# Patient Record
Sex: Male | Born: 1970 | Race: White | Hispanic: No | Marital: Married | Smoking: Never smoker
Health system: Southern US, Community
[De-identification: ages and names within clinical notes are randomized; demographics above are authoritative.]

## PROBLEM LIST (undated history)

## (undated) DIAGNOSIS — E119 Type 2 diabetes mellitus without complications: Secondary | ICD-10-CM

## (undated) DIAGNOSIS — I10 Essential (primary) hypertension: Secondary | ICD-10-CM

## (undated) DIAGNOSIS — J45909 Unspecified asthma, uncomplicated: Secondary | ICD-10-CM

## (undated) DIAGNOSIS — E785 Hyperlipidemia, unspecified: Secondary | ICD-10-CM

## (undated) HISTORY — PX: GASTRIC RESTRICTION SURGERY: SHX653

## (undated) HISTORY — PX: CHOLECYSTECTOMY: SHX55

## (undated) HISTORY — DX: Hyperlipidemia, unspecified: E78.5

---

## 1999-05-24 DIAGNOSIS — E1165 Type 2 diabetes mellitus with hyperglycemia: Secondary | ICD-10-CM | POA: Insufficient documentation

## 2005-12-11 ENCOUNTER — Other Ambulatory Visit: Payer: Self-pay

## 2005-12-11 ENCOUNTER — Emergency Department: Payer: Self-pay | Admitting: Unknown Physician Specialty

## 2007-02-25 ENCOUNTER — Emergency Department: Payer: Self-pay | Admitting: Internal Medicine

## 2007-04-26 ENCOUNTER — Ambulatory Visit: Payer: Self-pay | Admitting: Unknown Physician Specialty

## 2008-11-24 ENCOUNTER — Ambulatory Visit: Payer: Self-pay | Admitting: Internal Medicine

## 2009-12-13 ENCOUNTER — Ambulatory Visit: Payer: Self-pay | Admitting: Internal Medicine

## 2013-06-03 ENCOUNTER — Ambulatory Visit: Payer: Self-pay | Admitting: Family Medicine

## 2013-08-13 DIAGNOSIS — E66811 Obesity, class 1: Secondary | ICD-10-CM | POA: Insufficient documentation

## 2013-08-13 DIAGNOSIS — R809 Proteinuria, unspecified: Secondary | ICD-10-CM | POA: Insufficient documentation

## 2013-08-13 DIAGNOSIS — M722 Plantar fascial fibromatosis: Secondary | ICD-10-CM | POA: Diagnosis present

## 2013-08-13 DIAGNOSIS — K760 Fatty (change of) liver, not elsewhere classified: Secondary | ICD-10-CM | POA: Diagnosis present

## 2013-08-13 DIAGNOSIS — L739 Follicular disorder, unspecified: Secondary | ICD-10-CM | POA: Insufficient documentation

## 2014-01-01 DIAGNOSIS — R160 Hepatomegaly, not elsewhere classified: Secondary | ICD-10-CM | POA: Insufficient documentation

## 2014-08-19 DIAGNOSIS — E782 Mixed hyperlipidemia: Secondary | ICD-10-CM | POA: Insufficient documentation

## 2014-08-19 DIAGNOSIS — G4733 Obstructive sleep apnea (adult) (pediatric): Secondary | ICD-10-CM | POA: Insufficient documentation

## 2014-08-20 ENCOUNTER — Ambulatory Visit: Admit: 2014-08-20 | Disposition: A | Discharge status: Home / Self Care | Payer: Self-pay | Admitting: Internal Medicine

## 2014-09-11 DIAGNOSIS — G5601 Carpal tunnel syndrome, right upper limb: Secondary | ICD-10-CM | POA: Insufficient documentation

## 2014-10-16 DIAGNOSIS — I10 Essential (primary) hypertension: Secondary | ICD-10-CM | POA: Diagnosis present

## 2015-03-13 DIAGNOSIS — Z789 Other specified health status: Secondary | ICD-10-CM | POA: Insufficient documentation

## 2016-02-02 DIAGNOSIS — G8929 Other chronic pain: Secondary | ICD-10-CM | POA: Insufficient documentation

## 2016-08-30 DIAGNOSIS — I10 Essential (primary) hypertension: Secondary | ICD-10-CM | POA: Insufficient documentation

## 2016-08-30 DIAGNOSIS — E785 Hyperlipidemia, unspecified: Secondary | ICD-10-CM | POA: Diagnosis present

## 2016-08-30 DIAGNOSIS — E119 Type 2 diabetes mellitus without complications: Secondary | ICD-10-CM

## 2016-08-30 DIAGNOSIS — E1129 Type 2 diabetes mellitus with other diabetic kidney complication: Secondary | ICD-10-CM

## 2017-10-26 ENCOUNTER — Ambulatory Visit
Admission: EM | Admit: 2017-10-26 | Discharge: 2017-10-26 | Disposition: A | Discharge status: Home / Self Care | Payer: Self-pay | Attending: Family Medicine | Admitting: Family Medicine

## 2017-10-26 ENCOUNTER — Other Ambulatory Visit: Payer: Self-pay

## 2017-10-26 ENCOUNTER — Encounter: Payer: Self-pay | Admitting: Emergency Medicine

## 2017-10-26 DIAGNOSIS — H1011 Acute atopic conjunctivitis, right eye: Secondary | ICD-10-CM

## 2017-10-26 DIAGNOSIS — J3089 Other allergic rhinitis: Secondary | ICD-10-CM

## 2017-10-26 HISTORY — DX: Essential (primary) hypertension: I10

## 2017-10-26 HISTORY — DX: Type 2 diabetes mellitus without complications: E11.9

## 2017-10-26 MED ORDER — KETOTIFEN FUMARATE 0.025 % OP SOLN: 2.0000 [drp] | Freq: Two times a day (BID) | OPHTHALMIC | 0 refills | Status: DC

## 2017-10-26 MED ORDER — GENTAMICIN SULFATE 0.3 % OP SOLN: 2.0000 [drp] | Freq: Four times a day (QID) | OPHTHALMIC | 0 refills | Status: DC

## 2017-10-26 NOTE — ED Triage Notes (Signed)
Patient c/o redness, swelling and pain in his right eye that started yesterday.  Patient reports that he has been having allegries.

## 2017-10-26 NOTE — ED Provider Notes (Signed)
[facility]    CSN: [id number] Arrival date & time: 10/26/17  1726     History   Chief Complaint Chief Complaint  Patient presents with  . Eye Problem    right    HPI Caleb Nelson is a 47 y.o. male.   46 year old male presents with right eye redness, eye lid swelling and irritation that started last evening. Woke up with his right eye swollen shut with yellowish discharge this morning but now experiencing clear discharge. Right eye is still itchy and burning. Left eye is asymptomatic. He denies any fever or change in vision. He does not wear routine glasses or contacts. He does have a history of significant environmental allergies but not currently on any medication. He is currently experiencing a runny nose, scratchy throat and slight cough. He had hives and unknown allergic reaction over 1 week ago. He saw his PCP who put him on 6 days of Prednisone and Benadryl. His hives resolved and he finished the Prednisone and Benadryl 2 days ago. No new known exposure. He has not tried any OTC eye drops. He does have a history of HTN and DM but had gastric restriction surgery in January, has lost about 100 lbs and no longer on any medication except multivitamins and calcium.   The history is provided by the patient.  Eye Problem  Location:  Right eye Quality:  Tearing and burning Severity:  Moderate Onset quality:  Sudden Duration:  1 day Timing:  Constant Progression:  Waxing and waning Chronicity:  New Context: not burn, not chemical exposure, not contact lens problem, not direct trauma, not foreign body, not using machinery, not scratch, not smoke exposure and not UV exposure   Relieved by:  None tried Worsened by:  Nothing Ineffective treatments:  None tried Associated symptoms: crusting, discharge, itching, redness, swelling (right eye lid) and tearing   Associated symptoms: no blurred vision, no decreased vision, no double vision, no facial rash, no headaches, no  inflammation, no nausea, no numbness, no photophobia, no tingling, no vomiting and no weakness   Risk factors: no conjunctival hemorrhage, no exposure to pinkeye, no previous injury to eye and no recent herpes zoster     Past Medical History:  Diagnosis Date  . Diabetes mellitus without complication (HCC)   . Hypertension     There are no active problems to display for this patient.   Past Surgical History:  Procedure Laterality Date  . GASTRIC RESTRICTION SURGERY         Home Medications    Prior to Admission medications   Medication Sig Start Date End Date Taking? Authorizing Provider  glucose blood (PRECISION QID TEST) test strip Use 3 (three) times daily. Use as instructed. Contour DX: E11.9 11/29/16 11/05/17 Yes [provider]  pantoprazole (PROTONIX) 40 MG tablet Take by mouth. 05/25/17 11/21/17 Yes [provider]  gentamicin (GARAMYCIN) 0.3 % ophthalmic solution Place 2 drops into the right eye 4 (four) times daily. 10/26/17   Sudie Grumbling, NP  ketotifen (ZADITOR) 0.025 % ophthalmic solution Place 2 drops into the right eye 2 (two) times daily. 10/26/17   Sudie Grumbling, NP  Multiple Vitamin (MULTI-VITAMINS) TABS Take by mouth.    [provider]    Family History History reviewed. No pertinent family history.  Social History Social History   Tobacco Use  . Smoking status: Never Smoker  . Smokeless tobacco: Never Used  Substance Use Topics  . Alcohol use: Never  Frequency: Never  . Drug use: Never     Allergies   Patient has no known allergies.   Review of Systems Review of Systems  Constitutional: Negative for activity change, appetite change, chills, fatigue and fever.  HENT: Positive for congestion, facial swelling (around right eye), postnasal drip, rhinorrhea, sneezing and sore throat. Negative for ear discharge, ear pain, mouth sores, nosebleeds, sinus pressure, sinus pain and trouble swallowing.   Eyes: Positive for pain  (irritation), discharge, redness and itching. Negative for blurred vision, double vision, photophobia and visual disturbance.  Respiratory: Positive for cough. Negative for chest tightness, shortness of breath and wheezing.   Gastrointestinal: Negative for diarrhea, nausea and vomiting.  Musculoskeletal: Negative for arthralgias, myalgias, neck pain and neck stiffness.  Skin: Positive for color change. Negative for rash and wound.  Allergic/Immunologic: Positive for environmental allergies.  Neurological: Negative for dizziness, tingling, tremors, seizures, syncope, speech difficulty, weakness, light-headedness, numbness and headaches.  Hematological: Negative for adenopathy. Does not bruise/bleed easily.     Physical Exam Triage Vital Signs ED Triage Vitals  Enc Vitals Group     BP 10/26/17 1748 (!) 154/93     Pulse Rate 10/26/17 1748 71     Resp 10/26/17 1748 16     Temp 10/26/17 1748 99.3 F (37.4 C)     Temp Source 10/26/17 1748 Oral     SpO2 10/26/17 1748 100 %     Weight 10/26/17 1744 227 lb (103 kg)     Height 10/26/17 1744 6' (1.829 m)     Head Circumference --      Peak Flow --      Pain Score 10/26/17 1744 2     Pain Loc --      Pain Edu? --      Excl. in GC? --    No data found.  Updated Vital Signs BP (!) 154/93 (BP Location: Left Arm)   Pulse 71   Temp 99.3 F (37.4 C) (Oral)   Resp 16   Ht 6' (1.829 m)   Wt 227 lb (103 kg)   SpO2 100%   BMI 30.79 kg/m   Visual Acuity Right Eye Distance: 20/30 uncorrected Left Eye Distance: 20/25 uncorrected Bilateral Distance: 20/25 uncorrected  Right Eye Near:   Left Eye Near:    Bilateral Near:     Physical Exam  Constitutional: He is oriented to person, place, and time. He appears well-developed and well-nourished. He is cooperative. No distress.  Patient sitting on exam table in no acute distress. Right eye is noticeably red and swollen.   HENT:  Head: Normocephalic and atraumatic.  Right Ear: Hearing,  tympanic membrane, external ear and ear canal normal.  Left Ear: Hearing, tympanic membrane, external ear and ear canal normal.  Nose: Mucosal edema and rhinorrhea present. Right sinus exhibits no maxillary sinus tenderness and no frontal sinus tenderness. Left sinus exhibits no maxillary sinus tenderness and no frontal sinus tenderness.  Mouth/Throat: Uvula is midline and mucous membranes are normal. Oropharyngeal exudate (slight clear post nasal drainage present) present.  Eyes: Pupils are equal, round, and reactive to light. EOM are normal. Right eye exhibits chemosis and discharge. Right eye exhibits no hordeolum. No foreign body present in the right eye. Left eye exhibits no chemosis, no discharge and no hordeolum. No foreign body present in the left eye. Right conjunctiva is injected. Right conjunctiva has no hemorrhage. Left conjunctiva is not injected. Left conjunctiva has no hemorrhage. No scleral icterus.  Fundoscopic exam:  The right eye shows no hemorrhage and no papilledema. The right eye shows red reflex.       The left eye shows no hemorrhage and no papilledema. The left eye shows red reflex.    Right eyelid upper and lower lids swollen and red. Lower lid is slightly more swollen and irritated than upper lid. Non-tender. Clear drainage/tearing present. No pain with movement.   Neck: Normal range of motion. Neck supple.  Cardiovascular: Normal rate, regular rhythm and normal heart sounds.  No murmur heard. Pulmonary/Chest: Effort normal and breath sounds normal. No respiratory distress. He has no decreased breath sounds. He has no wheezes. He has no rhonchi. He has no rales.  Musculoskeletal: Normal range of motion.  Lymphadenopathy:    He has no cervical adenopathy.  Neurological: He is alert and oriented to person, place, and time.  Skin: Skin is warm and dry. Capillary refill takes less than 2 seconds. No rash noted. There is erythema (around right eye).  Psychiatric: He has  a normal mood and affect. His behavior is normal. Judgment and thought content normal.  Vitals reviewed.    UC Treatments / Results  Labs (all labs ordered are listed, but only abnormal results are displayed) Labs Reviewed - No data to display  EKG None  Radiology No results found.  Procedures Procedures (including critical care time)  Medications Ordered in UC Medications - No data to display  Initial Impression / Assessment and Plan / UC Course  I have reviewed the triage vital signs and the nursing notes.  Pertinent labs & imaging results that were available during my care of the patient were reviewed by me and considered in my medical decision making (see chart for details).    Reviewed with patient that he probably has allergic conjunctivitis. Recommend start Zaditor eye drops twice a day as directed. May continue Benadryl at night. Strongly encouraged to start OTC Claritin or Zyrtec daily to help with environmental allergies. May apply cool compresses to area for comfort as directed. Reviewed that we can not rule out bacterial conjunctivitis. Recommend if minimal improvement within 48 hours, start Gentamicin eye drops as directed- Rx written and printed for patient to take to pharmacy of choice if needed. Recommend follow-up here or with his PCP in 3 to 4 days if not improving.  Final Clinical Impressions(s) / UC Diagnoses   Final diagnoses:  Allergic conjunctivitis of right eye  Environmental and seasonal allergies     Discharge Instructions     Recommend start Zaditor eye drops- use 2 drops in right eye twice a day as directed. Start OTC Claritin (or similar) 10mg  daily. May also take Benadryl 25mg  at night to help with allergies. Apply cool compresses to right eye area as needed for comfort. If minimal improvement within 24 to 48 hours, recommend start Gentamicin antibiotic eye drops- 2 drops in right eye 4 times a day as directed. Follow-up here or with your PCP in 3  to 4 days if not improving.     ED Prescriptions    Medication Sig Dispense Auth. Provider   ketotifen (ZADITOR) 0.025 % ophthalmic solution Place 2 drops into the right eye 2 (two) times daily. 5 mL Sudie Grumbling, NP   gentamicin (GARAMYCIN) 0.3 % ophthalmic solution Place 2 drops into the right eye 4 (four) times daily. 5 mL Sudie Grumbling, NP     Controlled Substance Prescriptions [place] Controlled Substance Registry consulted? Not Applicable   Sudie Grumbling, NP 10/26/17  2317  

## 2017-10-26 NOTE — Discharge Instructions (Addendum)
Recommend start Zaditor eye drops- use 2 drops in right eye twice a day as directed. Start OTC Claritin (or similar) 10mg  daily. May also take Benadryl 25mg  at night to help with allergies. Apply cool compresses to right eye area as needed for comfort. If minimal improvement within 24 to 48 hours, recommend start Gentamicin antibiotic eye drops- 2 drops in right eye 4 times a day as directed. Follow-up here or with your PCP in 3 to 4 days if not improving.

## 2017-11-20 ENCOUNTER — Other Ambulatory Visit: Payer: Self-pay

## 2017-11-20 ENCOUNTER — Ambulatory Visit
Admission: EM | Admit: 2017-11-20 | Discharge: 2017-11-20 | Disposition: A | Discharge status: Home / Self Care | Payer: Self-pay | Attending: Family Medicine | Admitting: Family Medicine

## 2017-11-20 DIAGNOSIS — J069 Acute upper respiratory infection, unspecified: Secondary | ICD-10-CM

## 2017-11-20 DIAGNOSIS — R0981 Nasal congestion: Secondary | ICD-10-CM

## 2017-11-20 DIAGNOSIS — R05 Cough: Secondary | ICD-10-CM

## 2017-11-20 DIAGNOSIS — B9789 Other viral agents as the cause of diseases classified elsewhere: Secondary | ICD-10-CM

## 2017-11-20 MED ORDER — FLUTICASONE PROPIONATE 50 MCG/ACT NA SUSP: 1.0000 | Freq: Every day | NASAL | 0 refills | Status: DC

## 2017-11-20 MED ORDER — AMOXICILLIN-POT CLAVULANATE 875-125 MG PO TABS: 1.0000 | ORAL_TABLET | Freq: Two times a day (BID) | ORAL | 0 refills | Status: DC

## 2017-11-20 MED ORDER — HYDROCOD POLST-CPM POLST ER 10-8 MG/5ML PO SUER: 5.0000 mL | Freq: Every evening | ORAL | 0 refills | Status: DC | PRN

## 2017-11-20 NOTE — ED Triage Notes (Signed)
Patient complains of cough, congestion, runny nose, sinus pain and pressure x 5 days.

## 2017-11-20 NOTE — ED Provider Notes (Signed)
[facility] ____________________________________________  Time seen: Approximately 9:58 AM  I have reviewed the triage vital signs and the nursing notes.   HISTORY  Chief Complaint Cough   HPI Caleb Nelson is a 47 y.o. male presenting for evaluation of 5 days of runny nose, nasal congestion, cough and sinus pressure.  Patient states that initially started off with a sore throat and other symptoms followed.  States that he has had this "all of my life ", and stating that he has frequent sinus infection that requires antibiotic therapy.  Patient reports that cough is worse at night and disrupt sleep.  Patient states that he has been taking Claritin due to her recent allergic reaction, but states he has not been taking any other over-the-counter medications for the same complaints.  Denies known fevers.  Denies known sick contacts.  Continues to eat and drink well.  Denies other aggravating or alleviating factors.  Reports otherwise feels well. Denies chest pain, shortness of breath, or rash. Denies recent sickness.  Rayetta Humphrey, MD: PCP   Past Medical History:  Diagnosis Date  . Diabetes mellitus without complication (HCC)   . Hypertension     There are no active problems to display for this patient.   Past Surgical History:  Procedure Laterality Date  . GASTRIC RESTRICTION SURGERY       No current facility-administered medications for this encounter.   Current Outpatient Medications:  Marland Kitchen  Multiple Vitamin (MULTI-VITAMINS) TABS, Take by mouth., Disp: , Rfl:  .  pantoprazole (PROTONIX) 40 MG tablet, Take by mouth., Disp: , Rfl:  .  [START ON 11/23/2017] amoxicillin-clavulanate (AUGMENTIN) 875-125 MG tablet, Take 1 tablet by mouth every 12 (twelve) hours., Disp: 20 tablet, Rfl: 0 .  chlorpheniramine-HYDROcodone (TUSSIONEX PENNKINETIC ER) 10-8 MG/5ML SUER, Take 5 mLs by mouth at bedtime as needed for cough. do not drive or operate machinery while taking as can cause  drowsiness., Disp: 50 mL, Rfl: 0 .  fluticasone (FLONASE) 50 MCG/ACT nasal spray, Place 1 spray into both nostrils daily., Disp: 1 g, Rfl: 0  Allergies Patient has no known allergies.  History reviewed. No pertinent family history.  Social History Social History   Tobacco Use  . Smoking status: Never Smoker  . Smokeless tobacco: Never Used  Substance Use Topics  . Alcohol use: Never    Frequency: Never  . Drug use: Never    Review of Systems Constitutional: No fever/chills ENT: No sore throat. As above.  Cardiovascular: Denies chest pain. Respiratory: Denies shortness of breath. Gastrointestinal: No abdominal pain.   Skin: Negative for rash.   ____________________________________________   PHYSICAL EXAM:  VITAL SIGNS: ED Triage Vitals  Enc Vitals Group     BP 11/20/17 0920 136/79     Pulse Rate 11/20/17 0920 84     Resp 11/20/17 0920 18     Temp 11/20/17 0920 98 F (36.7 C)     Temp Source 11/20/17 0920 Oral     SpO2 11/20/17 0920 100 %     Weight 11/20/17 0917 198 lb (89.8 kg)     Height 11/20/17 0917 6' (1.829 m)     Head Circumference --      Peak Flow --      Pain Score 11/20/17 0917 0     Pain Loc --      Pain Edu? --      Excl. in GC? --    Constitutional: Alert and oriented. Well appearing and in no acute distress.  Eyes: Conjunctivae are normal.  Head: Atraumatic. No sinus tenderness to palpation. No swelling. No erythema.  Ears: no erythema, normal TMs bilaterally.   Nose:Nasal congestion   Mouth/Throat: Mucous membranes are moist. No pharyngeal erythema. No tonsillar swelling or exudate.  Neck: No stridor.  No cervical spine tenderness to palpation. Hematological/Lymphatic/Immunilogical: No cervical lymphadenopathy. Cardiovascular: Normal rate, regular rhythm. Grossly normal heart sounds.  Good peripheral circulation. Respiratory: Normal respiratory effort.  No retractions. No wheezes, rales or rhonchi. Good air movement.  Occasional dry cough  noted in room. Gastrointestinal: Soft and nontender.  Musculoskeletal: Ambulatory with steady gait.  Neurologic:  Normal speech and language. No gait instability. Skin:  Skin appears warm, dry and intact. No rash noted. Psychiatric: Mood and affect are normal. Speech and behavior are normal.  ___________________________________________   LABS (all labs ordered are listed, but only abnormal results are displayed)  Labs Reviewed - No data to display ____________________________________________  PROCEDURES Procedures   INITIAL IMPRESSION / ASSESSMENT AND PLAN / ED COURSE  Pertinent labs & imaging results that were available during my care of the patient were reviewed by me and considered in my medical decision making (see chart for details).  Well-appearing patient.  No acute distress.  Suspect viral upper respiratory infection.  Patient with history of recurrent sinus infections.  Will start patient on Flonase and PRN Tussionex at night.  Continue Claritin.  Encourage rest, fluids, supportive care.  Postdated Augmentin Rx given, for if symptoms persist after 3 days of supportive care.  Discussed strict follow-up and return parameters. Discussed indication, risks and benefits of medications with patient.  Discussed follow up with Primary care physician this week. Discussed follow up and return parameters including no resolution or any worsening concerns. Patient verbalized understanding and agreed to plan.   ____________________________________________   FINAL CLINICAL IMPRESSION(S) / ED DIAGNOSES  Final diagnoses:  Viral URI with cough  Sinus congestion     ED Discharge Orders        Ordered    amoxicillin-clavulanate (AUGMENTIN) 875-125 MG tablet  Every 12 hours     11/20/17 0954    fluticasone (FLONASE) 50 MCG/ACT nasal spray  Daily     11/20/17 0954    chlorpheniramine-HYDROcodone (TUSSIONEX PENNKINETIC ER) 10-8 MG/5ML SUER  At bedtime PRN     11/20/17 0954        Note: This dictation was prepared with Dragon dictation along with smaller phrase technology. Any transcriptional errors that result from this process are unintentional.         Renford DillsMiller, Kelina Beauchamp, NP 11/20/17 1049

## 2017-11-20 NOTE — Discharge Instructions (Addendum)
Take medication as prescribed. Rest. Drink plenty of fluids.  ° °Follow up with your primary care physician this week as needed. Return to Urgent care for new or worsening concerns.  ° °

## 2018-05-25 ENCOUNTER — Other Ambulatory Visit: Payer: Self-pay

## 2018-05-25 ENCOUNTER — Other Ambulatory Visit: Payer: Self-pay | Admitting: Gastroenterology

## 2018-05-25 ENCOUNTER — Ambulatory Visit
Admission: RE | Admit: 2018-05-25 | Discharge: 2018-05-25 | Disposition: A | Discharge status: Home / Self Care | Payer: BLUE CROSS/BLUE SHIELD | Source: Ambulatory Visit | Attending: Gastroenterology | Admitting: Gastroenterology

## 2018-05-25 DIAGNOSIS — R17 Unspecified jaundice: Secondary | ICD-10-CM

## 2018-05-25 DIAGNOSIS — R1084 Generalized abdominal pain: Secondary | ICD-10-CM

## 2018-05-25 DIAGNOSIS — R110 Nausea: Secondary | ICD-10-CM

## 2018-05-25 LAB — POCT I-STAT CREATININE: CREATININE: 0.6 mg/dL — AB (ref 0.61–1.24)

## 2018-05-25 LAB — HM HEPATITIS C SCREENING LAB: HM Hepatitis Screen: NEGATIVE

## 2018-05-25 MED ORDER — IOPAMIDOL (ISOVUE-300) INJECTION 61%
100.0000 mL | Freq: Once | INTRAVENOUS | Status: AC | PRN
  Administered 2018-05-25: 100 mL via INTRAVENOUS

## 2018-08-14 ENCOUNTER — Emergency Department
Admission: EM | Admit: 2018-08-14 | Discharge: 2018-08-14 | Disposition: A | Discharge status: Other Acute Inpatient Hospital | Payer: BLUE CROSS/BLUE SHIELD | Attending: Emergency Medicine | Admitting: Emergency Medicine

## 2018-08-14 ENCOUNTER — Other Ambulatory Visit: Payer: Self-pay

## 2018-08-14 ENCOUNTER — Emergency Department: Payer: BLUE CROSS/BLUE SHIELD

## 2018-08-14 ENCOUNTER — Ambulatory Visit (INDEPENDENT_AMBULATORY_CARE_PROVIDER_SITE_OTHER)
Admission: EM | Admit: 2018-08-14 | Discharge: 2018-08-14 | Disposition: A | Discharge status: Home / Self Care | Payer: BLUE CROSS/BLUE SHIELD | Source: Home / Self Care | Attending: Family Medicine | Admitting: Family Medicine

## 2018-08-14 ENCOUNTER — Encounter: Payer: Self-pay | Admitting: Emergency Medicine

## 2018-08-14 DIAGNOSIS — R509 Fever, unspecified: Secondary | ICD-10-CM | POA: Diagnosis not present

## 2018-08-14 DIAGNOSIS — R1013 Epigastric pain: Secondary | ICD-10-CM | POA: Diagnosis not present

## 2018-08-14 DIAGNOSIS — Z79899 Other long term (current) drug therapy: Secondary | ICD-10-CM | POA: Diagnosis not present

## 2018-08-14 DIAGNOSIS — G8918 Other acute postprocedural pain: Secondary | ICD-10-CM

## 2018-08-14 DIAGNOSIS — Y818 Miscellaneous general- and plastic-surgery devices associated with adverse incidents, not elsewhere classified: Secondary | ICD-10-CM | POA: Diagnosis not present

## 2018-08-14 DIAGNOSIS — E119 Type 2 diabetes mellitus without complications: Secondary | ICD-10-CM | POA: Diagnosis not present

## 2018-08-14 DIAGNOSIS — I10 Essential (primary) hypertension: Secondary | ICD-10-CM | POA: Insufficient documentation

## 2018-08-14 DIAGNOSIS — Z9049 Acquired absence of other specified parts of digestive tract: Secondary | ICD-10-CM | POA: Insufficient documentation

## 2018-08-14 DIAGNOSIS — T8143XA Infection following a procedure, organ and space surgical site, initial encounter: Secondary | ICD-10-CM

## 2018-08-14 DIAGNOSIS — T8149XA Infection following a procedure, other surgical site, initial encounter: Secondary | ICD-10-CM | POA: Insufficient documentation

## 2018-08-14 DIAGNOSIS — R1011 Right upper quadrant pain: Secondary | ICD-10-CM | POA: Diagnosis not present

## 2018-08-14 DIAGNOSIS — Z9889 Other specified postprocedural states: Secondary | ICD-10-CM

## 2018-08-14 HISTORY — DX: Unspecified asthma, uncomplicated: J45.909

## 2018-08-14 LAB — COMPREHENSIVE METABOLIC PANEL
ALBUMIN: 3.2 g/dL — AB (ref 3.5–5.0)
ALK PHOS: 91 U/L (ref 38–126)
ALT: 24 U/L (ref 0–44)
AST: 33 U/L (ref 15–41)
Anion gap: 9 (ref 5–15)
BILIRUBIN TOTAL: 0.9 mg/dL (ref 0.3–1.2)
BUN: 19 mg/dL (ref 6–20)
CALCIUM: 8.7 mg/dL — AB (ref 8.9–10.3)
CO2: 24 mmol/L (ref 22–32)
Chloride: 98 mmol/L (ref 98–111)
Creatinine, Ser: 1.15 mg/dL (ref 0.61–1.24)
GFR calc Af Amer: >60 mL/min (ref >60)
GLUCOSE: 118 mg/dL — AB (ref 70–99)
POTASSIUM: 4.1 mmol/L (ref 3.5–5.1)
Sodium: 131 mmol/L — ABNORMAL LOW (ref 135–145)
TOTAL PROTEIN: 8.3 g/dL — AB (ref 6.5–8.1)

## 2018-08-14 LAB — CBC WITH DIFFERENTIAL/PLATELET
Abs Immature Granulocytes: 0.11 10*3/uL — ABNORMAL HIGH (ref 0.00–0.07)
BASOS PCT: 0 %
Basophils Absolute: 0.1 10*3/uL (ref 0.0–0.1)
EOS ABS: 0.1 10*3/uL (ref 0.0–0.5)
EOS PCT: 0 %
HCT: 36.9 % — ABNORMAL LOW (ref 39.0–52.0)
Hemoglobin: 12 g/dL — ABNORMAL LOW (ref 13.0–17.0)
Immature Granulocytes: 1 %
Lymphocytes Relative: 6 %
Lymphs Abs: 1.2 10*3/uL (ref 0.7–4.0)
MCH: 27.5 pg (ref 26.0–34.0)
MCHC: 32.5 g/dL (ref 30.0–36.0)
MCV: 84.6 fL (ref 80.0–100.0)
MONO ABS: 1.5 10*3/uL — AB (ref 0.1–1.0)
MONOS PCT: 8 %
Neutro Abs: 16.1 10*3/uL — ABNORMAL HIGH (ref 1.7–7.7)
Neutrophils Relative %: 85 %
PLATELETS: 502 10*3/uL — AB (ref 150–400)
RBC: 4.36 MIL/uL (ref 4.22–5.81)
RDW: 13.2 % (ref 11.5–15.5)
WBC: 19 10*3/uL — AB (ref 4.0–10.5)
nRBC: 0 % (ref 0.0–0.2)

## 2018-08-14 LAB — URINALYSIS, COMPLETE (UACMP) WITH MICROSCOPIC
Bacteria, UA: NONE SEEN
Glucose, UA: NEGATIVE mg/dL
HGB URINE DIPSTICK: NEGATIVE
Ketones, ur: NEGATIVE mg/dL
Leukocytes,Ua: NEGATIVE
Nitrite: NEGATIVE
PROTEIN: 100 mg/dL — AB
Specific Gravity, Urine: 1.029 (ref 1.005–1.030)
pH: 5 (ref 5.0–8.0)

## 2018-08-14 LAB — LACTIC ACID, PLASMA: Lactic Acid, Venous: 1.2 mmol/L (ref 0.5–1.9)

## 2018-08-14 LAB — LIPASE, BLOOD: Lipase: 28 U/L (ref 11–51)

## 2018-08-14 MED ORDER — SODIUM CHLORIDE 0.9% FLUSH: 3.0000 mL | Freq: Once | INTRAVENOUS | Status: DC

## 2018-08-14 MED ORDER — ACETAMINOPHEN 500 MG PO TABS
1000.0000 mg | ORAL_TABLET | Freq: Once | ORAL | Status: AC
  Administered 2018-08-14: 1000 mg via ORAL

## 2018-08-14 MED ORDER — PIPERACILLIN-TAZOBACTAM 3.375 G IVPB 30 MIN
3.3750 g | Freq: Once | INTRAVENOUS | Status: AC
  Administered 2018-08-14: 3.375 g via INTRAVENOUS
  Filled 2018-08-14: qty 50

## 2018-08-14 MED ORDER — IOHEXOL 300 MG/ML  SOLN
100.0000 mL | Freq: Once | INTRAMUSCULAR | Status: AC | PRN
  Administered 2018-08-14: 100 mL via INTRAVENOUS

## 2018-08-14 MED ORDER — SODIUM CHLORIDE 0.9 % IV BOLUS
1000.0000 mL | Freq: Once | INTRAVENOUS | Status: AC
  Administered 2018-08-14: 1000 mL via INTRAVENOUS

## 2018-08-14 MED ORDER — IOHEXOL 240 MG/ML SOLN
25.0000 mL | INTRAMUSCULAR | Status: AC
  Administered 2018-08-14: 25 mL via ORAL

## 2018-08-14 NOTE — ED Notes (Signed)
Attempted to call report to [facility], but RN was not available.

## 2018-08-14 NOTE — ED Triage Notes (Signed)
Patient states that he had gallbladder removed on 08/03/2018. Patient states that he has noticed some incision site pain and has not ate in 3 days. Patient states that he has noticed some nausea and back pain that is mid back. Patient states that back pain is only when laying flat.

## 2018-08-14 NOTE — ED Notes (Signed)
Report given to [facility].

## 2018-08-14 NOTE — ED Notes (Signed)
Report given to Kate B RN 

## 2018-08-14 NOTE — ED Provider Notes (Signed)
[facility] Emergency Department Provider Note  Time seen: 5:20 PM  I have reviewed the triage vital signs and the nursing notes.   HISTORY  Chief Complaint Post-op Problem   HPI Caleb Nelson is a 48 y.o. male with a past medical history of hypertension, diabetes presents to the emergency department for abdominal pain, fever.  According to the patient for the past 4 days he has been experiencing a vague pain in the right upper quadrant and upper abdomen.  States the pain is progressively worsening, has been more fatigued has not been eating much over the past several days as well.  Has been drinking water.  No vomiting but does state nausea.  No diarrhea, bowel movement this morning.  No cough or congestion.  Patient went to urgent care this morning was found to be febrile and referred to the emergency department for further work-up.  Patient states his cholecystectomy was performed at [facility] by Dr. Smitty Cords.  Describes his pain currently is a 2 or 3/10, does not wish for any pain medication.   Past Medical History:  Diagnosis Date  . Diabetes mellitus without complication (HCC)   . Hypertension     There are no active problems to display for this patient.   Past Surgical History:  Procedure Laterality Date  . CHOLECYSTECTOMY    . GASTRIC RESTRICTION SURGERY      Prior to Admission medications   Medication Sig Start Date End Date Taking? Authorizing Provider  amoxicillin-clavulanate (AUGMENTIN) 875-125 MG tablet Take 1 tablet by mouth every 12 (twelve) hours. 11/23/17   Renford Dills, NP  chlorpheniramine-HYDROcodone [facility] PENNKINETIC ER) 10-8 MG/5ML SUER Take 5 mLs by mouth at bedtime as needed for cough. do not drive or operate machinery while taking as can cause drowsiness. 11/20/17   Renford Dills, NP  fluticasone (FLONASE) 50 MCG/ACT nasal spray Place 1 spray into both nostrils daily. 11/20/17   Renford Dills, NP  Multiple Vitamin (MULTI-VITAMINS)  TABS Take by mouth.    [provider]  pantoprazole (PROTONIX) 40 MG tablet Take by mouth. 05/25/17 11/21/17  [provider]    No Known Allergies  No family history on file.  Social History Social History   Tobacco Use  . Smoking status: Never Smoker  . Smokeless tobacco: Never Used  Substance Use Topics  . Alcohol use: Never    Frequency: Never  . Drug use: Never    Review of Systems Constitutional: Positive for fever, unknown to the patient prior to going to urgent care. ENT: Negative for recent illness/congestion Cardiovascular: Negative for chest pain. Respiratory: Negative for shortness of breath.  Negative for cough. Gastrointestinal: Generalized abdominal pain, more so in the upper abdomen.  Positive for nausea but negative for vomiting or diarrhea. Genitourinary: No dysuria but does state dark urine today. Musculoskeletal: Negative for musculoskeletal complaints Skin: Negative for skin complaints  Neurological: Negative for headache All other ROS negative  ____________________________________________   PHYSICAL EXAM:  VITAL SIGNS: ED Triage Vitals [08/14/18 1654]  Enc Vitals Group     BP 124/71     Pulse Rate 92     Resp 16     Temp 100 F (37.8 C)     Temp Source Oral     SpO2 98 %     Weight      Height      Head Circumference      Peak Flow      Pain Score 3  Pain Loc      Pain Edu?      Excl. in GC?    Constitutional: Alert and oriented. Well appearing and in no distress. Eyes: Normal exam ENT   Head: Normocephalic and atraumatic.   Mouth/Throat: Mucous membranes are moist. Cardiovascular: Normal rate, regular rhythm. No murmur Respiratory: Normal respiratory effort without tachypnea nor retractions. Breath sounds are clear  Gastrointestinal: Soft and nontender. No distention.   Musculoskeletal: Nontender with normal range of motion in all extremities Neurologic:  Normal speech and language. No gross focal  neurologic deficits  Skin:  Skin is warm, dry and intact.  Psychiatric: Mood and affect are normal.   ____________________________________________  RADIOLOGY  IMPRESSION: 1. Post recent cholecystectomy with large air-fluid collection in the cholecystectomy bed measuring 14 x 8.8 x 6.5 cm. Findings suspicious for postoperative abscess. Additional small crescentic fluid collection about the right lobe of the liver. Adjacent free fluid in the right abdomen tracks into the pericolic gutter. 2. Common bile duct not as well-defined on the current exam, but the previous biliary dilatation appears diminished. Previous filling defect in the distal common bile duct is no longer appreciated. 3. Gastrohepatic and upper abdominal adenopathy with slight progression from prior exam. This may be in part reactive. 4. Small 1.4 cm fluid collection in the left anterior abdominal wall a presumed laparoscopic port site may be postoperative seroma, sterility indeterminate by imaging. 5. Absent renal excretion on delayed phase imaging indicating renal dysfunction.  ____________________________________________   INITIAL IMPRESSION / ASSESSMENT AND PLAN / ED COURSE  Pertinent labs & imaging results that were available during my care of the patient were reviewed by me and considered in my medical decision making (see chart for details).  Patient presents to the emergency department for abdominal pain radiating to his back, more so across the upper abdomen.  Patient had a cholecystectomy performed 08/03/2018 at [facility].  Differential is quite broad at this time but would include postsurgical abscess, biliary leak, pancreatitis, colitis or diverticulitis, urinary tract infection or pyelonephritis.  We will check labs including blood work and urinalysis.  We will obtain CT imaging of the abdomen/pelvis to further evaluate.  Patient agreeable to plan of care.  We will IV hydrate while awaiting results.  CT  scan shows a very large collection of fluid in the right upper quadrant likely postsurgical abscess.  I have written the patient for IV Zosyn.  I have discussed the patient with surgery at [facility], they have accepted the patient as a transfer back to their facility for further treatment.  I discussed this with the patient he is agreeable to this plan of care.  Awaiting transport at this time.  ____________________________________________   FINAL CLINICAL IMPRESSION(S) / ED DIAGNOSES  Abdominal pain Fever Postoperative abscess Intra-abdominal abscess   Minna Antis, MD 08/14/18 2230

## 2018-08-14 NOTE — Discharge Instructions (Addendum)
Go directly to the emergency room as discussed.  °

## 2018-08-14 NOTE — ED Notes (Signed)
Transport at bedside  

## 2018-08-14 NOTE — ED Notes (Signed)
EMTALA checked for completion  

## 2018-08-14 NOTE — ED Notes (Signed)
Patient ambulated to and from room  Commode with a steady gait.

## 2018-08-14 NOTE — ED Triage Notes (Addendum)
PT sent from [facility] with c/o post op pain to abdomen. PT had cholecystectomy x2wks ago. Pt appears pale. One Incision site appears swollen and red. Pt A&OX4

## 2018-08-14 NOTE — ED Notes (Signed)
Patient c/o increased abdominal pain and nausea when drinking oral contrast. Dr. Lenard Lance aware.

## 2018-08-14 NOTE — ED Notes (Signed)
Patient c/o increased nausea after drinking oral contrast. Patient declined nausea medications. Dr. Lenard Lance aware.

## 2018-08-14 NOTE — ED Provider Notes (Addendum)
[facility] ____________________________________________  Time seen: Approximately 4:22 PM  I have reviewed the triage vital signs and the nursing notes.   HISTORY  Chief Complaint Back Pain   HPI Caleb Nelson is a 48 y.o. male presenting for evaluation of epigastric and right upper quadrant abdominal pain present since Saturday.  Patient reports he is 2 weeks post cholecystectomy from outpatient surgery.  States initially he felt like he was quickly improving and doing well, but reports Saturday he has been having pain and felt tired.  Has not eaten any solid foods in the last 3 days, but has continued to tolerate water well.  Denies any known fevers.  No chest pain or shortness of breath.  Some nausea but no vomiting.  Some loose stool.  No dysuria.  Denies other accompanying cough, sore throat.  Occasional nasal congestion consistent with his allergies.  States he has lost 20 pounds in the last 2 weeks.  No medications taken today prior to arrival.   Past Medical History:  Diagnosis Date  . Diabetes mellitus without complication (HCC)   . Hypertension     There are no active problems to display for this patient.   Past Surgical History:  Procedure Laterality Date  . GASTRIC RESTRICTION SURGERY        Current Facility-Administered Medications:  .  acetaminophen (TYLENOL) tablet 1,000 mg, 1,000 mg, Oral, Once, Renford Dills, NP  Current Outpatient Medications:  Marland Kitchen  Multiple Vitamin (MULTI-VITAMINS) TABS, Take by mouth., Disp: , Rfl:  .  amoxicillin-clavulanate (AUGMENTIN) 875-125 MG tablet, Take 1 tablet by mouth every 12 (twelve) hours., Disp: 20 tablet, Rfl: 0 .  chlorpheniramine-HYDROcodone (TUSSIONEX PENNKINETIC ER) 10-8 MG/5ML SUER, Take 5 mLs by mouth at bedtime as needed for cough. do not drive or operate machinery while taking as can cause drowsiness., Disp: 50 mL, Rfl: 0 .  fluticasone (FLONASE) 50 MCG/ACT nasal spray, Place 1 spray into both nostrils  daily., Disp: 1 g, Rfl: 0 .  pantoprazole (PROTONIX) 40 MG tablet, Take by mouth., Disp: , Rfl:   Allergies Patient has no known allergies.  History reviewed. No pertinent family history.  Social History Social History   Tobacco Use  . Smoking status: Never Smoker  . Smokeless tobacco: Never Used  Substance Use Topics  . Alcohol use: Never    Frequency: Never  . Drug use: Never    Review of Systems Constitutional: No known fever ENT: No sore throat. Cardiovascular: Denies chest pain. Respiratory: Denies shortness of breath. Gastrointestinal: As above.  Genitourinary: Negative for dysuria. Musculoskeletal: Negative for back pain. Skin: Negative for rash.   ____________________________________________   PHYSICAL EXAM:  VITAL SIGNS: ED Triage Vitals  Enc Vitals Group     BP 08/14/18 1555 111/64     Pulse Rate 08/14/18 1555 92     Resp 08/14/18 1555 16     Temp 08/14/18 1555 (!) 101.5 F (38.6 C)     Temp Source 08/14/18 1555 Oral     SpO2 08/14/18 1555 100 %     Weight 08/14/18 1553 177 lb (80.3 kg)     Height 08/14/18 1553 6' (1.829 m)     Head Circumference --      Peak Flow --      Pain Score 08/14/18 1552 3     Pain Loc --      Pain Edu? --      Excl. in GC? --     Constitutional: Alert and oriented.  Appears  ill. Cardiovascular: Normal rate, regular rhythm. Grossly normal heart sounds.  Good peripheral circulation. Respiratory: Normal respiratory effort without tachypnea nor retractions. Breath sounds are clear and equal bilaterally. No wheezes, rales, rhonchi. Gastrointestinal: 8 surgical abdominal wounds present, well approximated, left lower with mild surrounding erythema.  Abdomen soft.  Moderate epigastric to right upper quadrant tenderness.  Positive right CVA tenderness.  No left CVA tenderness. Musculoskeletal: No midline cervical thoracic or lumbar tenderness Neurologic:  Normal speech and language. Speech is normal. No gait instability.  Skin:   Skin is warm, dry. Psychiatric: Mood and affect are normal. Speech and behavior are normal.    ___________________________________________   LABS (all labs ordered are listed, but only abnormal results are displayed)  Labs Reviewed - No data to display   PROCEDURES Procedures    INITIAL IMPRESSION / ASSESSMENT AND PLAN / ED COURSE  Pertinent labs & imaging results that were available during my care of the patient were reviewed by me and considered in my medical decision making (see chart for details).  Patient presenting with epigastric and right upper quadrant abdominal pain 2 weeks post cholecystectomy, febrile in urgent care.  Recommend further evaluation in emergency room at this time.  Patient states he will go to [facility], triage nurse called and informed.  1 g oral Tylenol given prior to discharge.  Patient agreed to plan. ____________________________________________   FINAL CLINICAL IMPRESSION(S) / ED DIAGNOSES  Final diagnoses:  Epigastric pain  Status post surgery  Fever, unspecified     ED Discharge Orders    None       Note: This dictation was prepared with Dragon dictation along with smaller phrase technology. Any transcriptional errors that result from this process are unintentional.         Renford Dills, NP 08/14/18 1723

## 2018-08-15 DIAGNOSIS — E43 Unspecified severe protein-calorie malnutrition: Secondary | ICD-10-CM | POA: Insufficient documentation

## 2018-08-15 MED ORDER — ONDANSETRON HCL 4 MG/2ML IJ SOLN
4.00 | INTRAMUSCULAR | Status: DC
Start: ? — End: 2018-08-15

## 2018-08-15 MED ORDER — ACETAMINOPHEN 325 MG PO TABS
650.00 | ORAL_TABLET | ORAL | Status: DC
Start: ? — End: 2018-08-15

## 2018-08-15 MED ORDER — OXYCODONE HCL 5 MG PO TABS
5.00 | ORAL_TABLET | ORAL | Status: DC
Start: ? — End: 2018-08-15

## 2018-08-15 MED ORDER — MORPHINE SULFATE 2 MG/ML IJ SOLN
1.00 | INTRAMUSCULAR | Status: DC
Start: ? — End: 2018-08-15

## 2018-08-15 MED ORDER — HYDRALAZINE HCL 20 MG/ML IJ SOLN
5.00 | INTRAMUSCULAR | Status: DC
Start: ? — End: 2018-08-15

## 2018-08-15 MED ORDER — ENALAPRILAT 1.25 MG/ML IV INJ
1.25 | INJECTION | INTRAVENOUS | Status: DC
Start: ? — End: 2018-08-15

## 2018-08-15 MED ORDER — PIPERACILLIN-TAZOBACTAM 3.375 G IVPB
3.38 | INTRAVENOUS | Status: DC
Start: 2018-08-15 — End: 2018-08-15

## 2018-08-15 MED ORDER — GENERIC EXTERNAL MEDICATION
Status: DC
Start: ? — End: 2018-08-15

## 2018-08-15 MED ORDER — DIPHENHYDRAMINE HCL 50 MG/ML IJ SOLN
12.50 | INTRAMUSCULAR | Status: DC
Start: ? — End: 2018-08-15

## 2018-08-15 MED ORDER — INFLUENZA VAC SPLIT QUAD 0.5 ML IM SUSY
0.50 | PREFILLED_SYRINGE | INTRAMUSCULAR | Status: DC
Start: ? — End: 2018-08-15

## 2018-08-15 MED ORDER — SODIUM CHLORIDE 0.9 % IV SOLN
75.00 | INTRAVENOUS | Status: DC
Start: ? — End: 2018-08-15

## 2019-02-17 ENCOUNTER — Other Ambulatory Visit: Payer: Self-pay

## 2019-02-17 ENCOUNTER — Ambulatory Visit
Admission: EM | Admit: 2019-02-17 | Discharge: 2019-02-17 | Disposition: A | Discharge status: Home / Self Care | Payer: BC Managed Care – PPO | Attending: Family Medicine | Admitting: Family Medicine

## 2019-02-17 ENCOUNTER — Encounter: Payer: Self-pay | Admitting: Emergency Medicine

## 2019-02-17 DIAGNOSIS — R110 Nausea: Secondary | ICD-10-CM

## 2019-02-17 DIAGNOSIS — R51 Headache: Secondary | ICD-10-CM

## 2019-02-17 DIAGNOSIS — R197 Diarrhea, unspecified: Secondary | ICD-10-CM

## 2019-02-17 LAB — BASIC METABOLIC PANEL
Anion gap: 6 (ref 5–15)
BUN: 15 mg/dL (ref 6–20)
CO2: 25 mmol/L (ref 22–32)
Calcium: 8.5 mg/dL — ABNORMAL LOW (ref 8.9–10.3)
Chloride: 104 mmol/L (ref 98–111)
Creatinine, Ser: 0.95 mg/dL (ref 0.61–1.24)
GFR calc Af Amer: >60 mL/min (ref >60)
GFR calc non Af Amer: >60 mL/min (ref >60)
Glucose, Bld: 104 mg/dL — ABNORMAL HIGH (ref 70–99)
Potassium: 3.4 mmol/L — ABNORMAL LOW (ref 3.5–5.1)
Sodium: 135 mmol/L (ref 135–145)

## 2019-02-17 MED ORDER — ONDANSETRON 8 MG PO TBDP
8.0000 mg | ORAL_TABLET | Freq: Once | ORAL | Status: AC
  Administered 2019-02-17: 8 mg via ORAL

## 2019-02-17 MED ORDER — ONDANSETRON 8 MG PO TBDP: 8.0000 mg | ORAL_TABLET | Freq: Three times a day (TID) | ORAL | 0 refills | Status: DC | PRN

## 2019-02-17 NOTE — Discharge Instructions (Addendum)
Clear liquids then advance diet slowly as tolerated Over the counter Imodium AD as needed

## 2019-02-17 NOTE — ED Triage Notes (Addendum)
Pt c/o fatigue, diarrhea, nausea, headache, dizziness. Started about 3 days but much worse yesterday about 3 pm. Denies fever. Denies URI symptoms.

## 2019-02-17 NOTE — ED Provider Notes (Signed)
[facility]    CSN: [id number] Arrival date & time: 02/17/19  1248      History   Chief Complaint Chief Complaint  Patient presents with  . Diarrhea  . Headache    HPI Caleb Nelson is a 48 y.o. male.   48 yo male with a c/o nausea, diarrhea, body aches, headache and fatigue for the past 3 days. Denies any abdominal pain, melena, hematochezia, vomiting. Denies URI symptoms, known sick contacts, recent travel.    Diarrhea Associated symptoms: headaches   Headache Associated symptoms: diarrhea     Past Medical History:  Diagnosis Date  . Asthma     There are no active problems to display for this patient.   Past Surgical History:  Procedure Laterality Date  . CHOLECYSTECTOMY    . GASTRIC RESTRICTION SURGERY         Home Medications    Prior to Admission medications   Medication Sig Start Date End Date Taking? Authorizing Provider  amoxicillin-clavulanate (AUGMENTIN) 875-125 MG tablet Take 1 tablet by mouth every 12 (twelve) hours. 11/23/17   Marylene Land, NP  chlorpheniramine-HYDROcodone [facility] PENNKINETIC ER) 10-8 MG/5ML SUER Take 5 mLs by mouth at bedtime as needed for cough. do not drive or operate machinery while taking as can cause drowsiness. 11/20/17   Marylene Land, NP  fluticasone (FLONASE) 50 MCG/ACT nasal spray Place 1 spray into both nostrils daily. 11/20/17   Marylene Land, NP  Multiple Vitamin (MULTI-VITAMINS) TABS Take by mouth.    [provider]  ondansetron (ZOFRAN ODT) 8 MG disintegrating tablet Take 1 tablet (8 mg total) by mouth every 8 (eight) hours as needed. 02/17/19   Norval Gable, MD  pantoprazole (PROTONIX) 40 MG tablet Take by mouth. 05/25/17 11/21/17  [provider]    Family History History reviewed. No pertinent family history.  Social History Social History   Tobacco Use  . Smoking status: Never Smoker  . Smokeless tobacco: Never Used  Substance Use Topics  . Alcohol use: Never   Frequency: Never  . Drug use: Never     Allergies   Patient has no known allergies.   Review of Systems Review of Systems  Gastrointestinal: Positive for diarrhea.  Neurological: Positive for headaches.     Physical Exam Triage Vital Signs ED Triage Vitals  Enc Vitals Group     BP 02/17/19 1303 (!) 143/73     Pulse Rate 02/17/19 1303 90     Resp 02/17/19 1303 18     Temp 02/17/19 1303 99.4 F (37.4 C)     Temp Source 02/17/19 1303 Oral     SpO2 02/17/19 1303 100 %     Weight 02/17/19 1259 205 lb (93 kg)     Height 02/17/19 1259 6' (1.829 m)     Head Circumference --      Peak Flow --      Pain Score 02/17/19 1259 2     Pain Loc --      Pain Edu? --      Excl. in [place]? --    No data found.  Updated Vital Signs BP (!) 143/73 (BP Location: Left Arm)   Pulse 90   Temp 99.4 F (37.4 C) (Oral)   Resp 18   Ht 6' (1.829 m)   Wt 93 kg   SpO2 100%   BMI 27.80 kg/m   Visual Acuity Right Eye Distance:   Left Eye Distance:   Bilateral Distance:    Right  Eye Near:   Left Eye Near:    Bilateral Near:     Physical Exam Vitals signs and nursing note reviewed.  Constitutional:      General: He is not in acute distress.    Appearance: He is not toxic-appearing or diaphoretic.  Cardiovascular:     Rate and Rhythm: Normal rate.  Pulmonary:     Effort: Pulmonary effort is normal. No respiratory distress.  Abdominal:     General: Bowel sounds are normal. There is no distension.     Palpations: Abdomen is soft. There is no mass.     Tenderness: There is no abdominal tenderness. There is no right CVA tenderness, left CVA tenderness, guarding or rebound.     Hernia: No hernia is present.  Skin:    Findings: No rash.  Neurological:     Mental Status: He is alert.      UC Treatments / Results  Labs (all labs ordered are listed, but only abnormal results are displayed) Labs Reviewed  BASIC METABOLIC PANEL - Abnormal; Notable for the following components:       Result Value   Potassium 3.4 (*)    Glucose, Bld 104 (*)    Calcium 8.5 (*)    All other components within normal limits  NOVEL CORONAVIRUS, NAA (HOSP ORDER, SEND-OUT TO REF LAB; TAT 18-24 HRS)  GI PATHOGEN PANEL BY PCR, STOOL    EKG   Radiology No results found.  Procedures Procedures (including critical care time)  Medications Ordered in UC Medications  ondansetron (ZOFRAN-ODT) disintegrating tablet 8 mg (8 mg Oral Given 02/17/19 1345)    Initial Impression / Assessment and Plan / UC Course  I have reviewed the triage vital signs and the nursing notes.  Pertinent labs & imaging results that were available during my care of the patient were reviewed by me and considered in my medical decision making (see chart for details).      Final Clinical Impressions(s) / UC Diagnoses   Final diagnoses:  Diarrhea, unspecified type  Nausea     Discharge Instructions     Clear liquids then advance diet slowly as tolerated Over the counter Imodium AD as needed     ED Prescriptions    Medication Sig Dispense Auth. Provider   ondansetron (ZOFRAN ODT) 8 MG disintegrating tablet Take 1 tablet (8 mg total) by mouth every 8 (eight) hours as needed. 6 tablet Payton Mccallum, MD      1. Lab results and diagnosis reviewed with patient; given zofran 8mg  odt 2. rx as per orders above; reviewed possible side effects, interactions, risks and benefits  3. Recommend supportive treatment as above 4. Check GI panel 5. Follow-up prn if symptoms worsen or don't improve   PDMP not reviewed this encounter.   , MD 02/17/19 1504

## 2019-02-18 LAB — NOVEL CORONAVIRUS, NAA (HOSP ORDER, SEND-OUT TO REF LAB; TAT 18-24 HRS): SARS-CoV-2, NAA: NOT DETECTED

## 2019-02-19 ENCOUNTER — Other Ambulatory Visit: Payer: Self-pay

## 2019-02-19 ENCOUNTER — Emergency Department
Admission: EM | Admit: 2019-02-19 | Discharge: 2019-02-19 | Disposition: A | Discharge status: Home / Self Care | Payer: BC Managed Care – PPO | Attending: Emergency Medicine | Admitting: Emergency Medicine

## 2019-02-19 ENCOUNTER — Emergency Department: Payer: BC Managed Care – PPO

## 2019-02-19 ENCOUNTER — Encounter: Payer: Self-pay | Admitting: Emergency Medicine

## 2019-02-19 DIAGNOSIS — K529 Noninfective gastroenteritis and colitis, unspecified: Secondary | ICD-10-CM

## 2019-02-19 DIAGNOSIS — R109 Unspecified abdominal pain: Secondary | ICD-10-CM | POA: Diagnosis present

## 2019-02-19 DIAGNOSIS — K5289 Other specified noninfective gastroenteritis and colitis: Secondary | ICD-10-CM | POA: Diagnosis not present

## 2019-02-19 LAB — CBC
HCT: 42.1 % (ref 39.0–52.0)
Hemoglobin: 14.6 g/dL (ref 13.0–17.0)
MCH: 27.6 pg (ref 26.0–34.0)
MCHC: 34.7 g/dL (ref 30.0–36.0)
MCV: 79.6 fL — ABNORMAL LOW (ref 80.0–100.0)
Platelets: 270 10*3/uL (ref 150–400)
RBC: 5.29 MIL/uL (ref 4.22–5.81)
RDW: 14.6 % (ref 11.5–15.5)
WBC: 8.1 10*3/uL (ref 4.0–10.5)
nRBC: 0 % (ref 0.0–0.2)

## 2019-02-19 LAB — URINALYSIS, COMPLETE (UACMP) WITH MICROSCOPIC
Bacteria, UA: NONE SEEN
Glucose, UA: NEGATIVE mg/dL
Ketones, ur: 20 mg/dL — AB
Leukocytes,Ua: NEGATIVE
Nitrite: NEGATIVE
Protein, ur: 100 mg/dL — AB
Specific Gravity, Urine: 1.033 — ABNORMAL HIGH (ref 1.005–1.030)
pH: 6 (ref 5.0–8.0)

## 2019-02-19 LAB — COMPREHENSIVE METABOLIC PANEL
ALT: 25 U/L (ref 0–44)
AST: 22 U/L (ref 15–41)
Albumin: 3.9 g/dL (ref 3.5–5.0)
Alkaline Phosphatase: 101 U/L (ref 38–126)
Anion gap: 10 (ref 5–15)
BUN: 17 mg/dL (ref 6–20)
CO2: 24 mmol/L (ref 22–32)
Calcium: 9.3 mg/dL (ref 8.9–10.3)
Chloride: 103 mmol/L (ref 98–111)
Creatinine, Ser: 1.02 mg/dL (ref 0.61–1.24)
GFR calc Af Amer: >60 mL/min (ref >60)
GFR calc non Af Amer: >60 mL/min (ref >60)
Glucose, Bld: 121 mg/dL — ABNORMAL HIGH (ref 70–99)
Potassium: 3.6 mmol/L (ref 3.5–5.1)
Sodium: 137 mmol/L (ref 135–145)
Total Bilirubin: 1.1 mg/dL (ref 0.3–1.2)
Total Protein: 8.2 g/dL — ABNORMAL HIGH (ref 6.5–8.1)

## 2019-02-19 LAB — LIPASE, BLOOD: Lipase: 29 U/L (ref 11–51)

## 2019-02-19 MED ORDER — ONDANSETRON HCL 4 MG/2ML IJ SOLN
4.0000 mg | Freq: Once | INTRAMUSCULAR | Status: AC
  Administered 2019-02-19: 22:00:00 4 mg via INTRAVENOUS

## 2019-02-19 MED ORDER — AMOXICILLIN-POT CLAVULANATE 875-125 MG PO TABS: 1.0000 | ORAL_TABLET | Freq: Two times a day (BID) | ORAL | 0 refills | Status: AC

## 2019-02-19 MED ORDER — ONDANSETRON HCL 4 MG/2ML IJ SOLN
INTRAMUSCULAR | Status: AC
  Administered 2019-02-19: 22:00:00 4 mg via INTRAVENOUS
  Filled 2019-02-19: qty 2

## 2019-02-19 MED ORDER — DICYCLOMINE HCL 20 MG PO TABS: 20.0000 mg | ORAL_TABLET | Freq: Three times a day (TID) | ORAL | 0 refills | Status: DC | PRN

## 2019-02-19 MED ORDER — IOHEXOL 300 MG/ML  SOLN
100.0000 mL | Freq: Once | INTRAMUSCULAR | Status: AC | PRN
  Administered 2019-02-19: 100 mL via INTRAVENOUS

## 2019-02-19 NOTE — ED Notes (Signed)
Pt in NAD. Pt steady walking to room.

## 2019-02-19 NOTE — Discharge Instructions (Addendum)
Please seek medical attention for any high fevers, chest pain, shortness of breath, change in behavior, persistent vomiting, bloody stool or any other new or concerning symptoms.  

## 2019-02-19 NOTE — ED Notes (Signed)
Lab notified to add on urine culture 

## 2019-02-19 NOTE — ED Triage Notes (Signed)
Pt presents to ED with LUQ abd pain and +nausea since Friday and fever since monday. Seen by [facility] Sunday and was told he has a stomach bug. Feeling worse today. Tested for COVID with negative result. Pt states he feels like there is a palpable knot where his pain is located. Pain increases with palpation.

## 2019-02-19 NOTE — ED Provider Notes (Signed)
[facility] Emergency Department Provider Note ____________________________________________   I have reviewed the triage vital signs and the nursing notes.   HISTORY  Chief Complaint Abdominal Pain   History limited by: Not Limited   HPI Caleb Nelson is a 48 y.o. male who presents to the emergency department today because of concerns for abdominal pain.  Patient states that he started feeling unwell roughly 1 week ago.  Initially he just sort of felt weak and out of it.  He then started developing some abdominal pain and nausea and diarrhea.  Went to urgent care 2 days ago and tested negative for COVID.  He was told that he likely had a stomach bug.  He states that his pain is continued.  He feels some distention on the left upper quadrant of his abdomen.  He denies similar symptoms in the past.  Records reviewed. Per medical record review patient has a history of cholecystectomy  History reviewed. No pertinent past medical history.  There are no active problems to display for this patient.   Past Surgical History:  Procedure Laterality Date  . CHOLECYSTECTOMY    . GASTRIC RESTRICTION SURGERY      Prior to Admission medications   Medication Sig Start Date End Date Taking? Authorizing Provider  amoxicillin-clavulanate (AUGMENTIN) 875-125 MG tablet Take 1 tablet by mouth every 12 (twelve) hours. 11/23/17   Renford Dills, NP  chlorpheniramine-HYDROcodone [facility] PENNKINETIC ER) 10-8 MG/5ML SUER Take 5 mLs by mouth at bedtime as needed for cough. do not drive or operate machinery while taking as can cause drowsiness. 11/20/17   Renford Dills, NP  fluticasone (FLONASE) 50 MCG/ACT nasal spray Place 1 spray into both nostrils daily. 11/20/17   Renford Dills, NP  Multiple Vitamin (MULTI-VITAMINS) TABS Take by mouth.    [provider]  ondansetron (ZOFRAN ODT) 8 MG disintegrating tablet Take 1 tablet (8 mg total) by mouth every 8 (eight) hours as  needed. 02/17/19   Payton Mccallum, MD  pantoprazole (PROTONIX) 40 MG tablet Take by mouth. 05/25/17 11/21/17  [provider]    Allergies Patient has no known allergies.  No family history on file.  Social History Social History   Tobacco Use  . Smoking status: Never Smoker  . Smokeless tobacco: Never Used  Substance Use Topics  . Alcohol use: Never    Frequency: Never  . Drug use: Never    Review of Systems Constitutional: No fever/chills Eyes: No visual changes. ENT: No sore throat. Cardiovascular: Denies chest pain. Respiratory: Denies shortness of breath. Gastrointestinal: Positive for abdominal pain, nausea. Positive for diarrhea.  Genitourinary: Negative for dysuria. Musculoskeletal: Negative for back pain. Skin: Negative for rash. Neurological: Negative for headaches, focal weakness or numbness.  ____________________________________________   PHYSICAL EXAM:  VITAL SIGNS: ED Triage Vitals [02/19/19 1949]  Enc Vitals Group     BP 129/80     Pulse Rate 88     Resp 18     Temp 99 F (37.2 C)     Temp Source Oral     SpO2 97 %     Weight 205 lb (93 kg)     Height 6' (1.829 m)     Head Circumference      Peak Flow      Pain Score 6   Constitutional: Alert and oriented.  Eyes: Conjunctivae are normal.  ENT      Head: Normocephalic and atraumatic.      Nose: No congestion/rhinnorhea.  Mouth/Throat: Mucous membranes are moist.      Neck: No stridor. Hematological/Lymphatic/Immunilogical: No cervical lymphadenopathy. Cardiovascular: Normal rate, regular rhythm.  No murmurs, rubs, or gallops. Respiratory: Normal respiratory effort without tachypnea nor retractions. Breath sounds are clear and equal bilaterally. No wheezes/rales/rhonchi. Gastrointestinal: Soft and somewhat diffusely tender to palpation. Worse in the upper and left abdomen. Genitourinary: Deferred Musculoskeletal: Normal range of motion in all extremities. No lower extremity  edema. Neurologic:  Normal speech and language. No gross focal neurologic deficits are appreciated.  Skin:  Skin is warm, dry and intact. No rash noted. Psychiatric: Mood and affect are normal. Speech and behavior are normal. Patient exhibits appropriate insight and judgment.  ____________________________________________    LABS (pertinent positives/negatives)  Lipase 29 CMP wnl except glu 121, t pro 8.2 CBC wbc 8.1, hgb 14.6, plt 270 UA hazy, small hgb dipstick, ketones 20, protein 100, 6-10 rbc and wbc  ____________________________________________   EKG  I, Nance Pear, attending physician, personally viewed and interpreted this EKG  EKG Time: 1949 Rate: 92 Rhythm: normal sinus rhythm Axis: left axis deviation Intervals: qtc 457 QRS: narrow ST changes: no st elevation Impression: abnormal ekg ____________________________________________    RADIOLOGY  CT ab/pel Diffuse large cell inflammation  ____________________________________________   PROCEDURES  Procedures  ____________________________________________   INITIAL IMPRESSION / ASSESSMENT AND PLAN / ED COURSE  Pertinent labs & imaging results that were available during my care of the patient were reviewed by me and considered in my medical decision making (see chart for details).   Patient presented to emergency department today because of concerns for continued abdominal pain, nausea and diarrhea.  On exam patient has somewhat diffuse tenderness however it is slightly worse on the left and upper abdomen.  Blood work without any concerning leukocytosis or electrolyte abnormality.  Given his tenderness and persistence of symptoms a CT scan was obtained.  This did show diffuse large bowel inflammation concerning for colitis.  Discussed this with the patient.  I do think patient would benefit from antibiotics given large portion of colon involved.  Did however discuss could likely be viral in nature.  Will give   patient GI follow-up information.  ____________________________________________   FINAL CLINICAL IMPRESSION(S) / ED DIAGNOSES  Final diagnoses:  Colitis     Note: This dictation was prepared with Dragon dictation. Any transcriptional errors that result from this process are unintentional     Nance Pear, MD 02/19/19 2308

## 2019-02-19 NOTE — ED Notes (Signed)
EDP Archie Balboa at bedside. Pt given personal phone from bench as requested.

## 2019-02-21 LAB — GI PATHOGEN PANEL BY PCR, STOOL
Adenovirus F 40/41: NOT DETECTED
Astrovirus: NOT DETECTED
C difficile toxin A/B: DETECTED — AB
Campylobacter by PCR: DETECTED — AB
Cryptosporidium by PCR: NOT DETECTED
Cyclospora cayetanensis: NOT DETECTED
E coli (ETEC) LT/ST: NOT DETECTED
E coli (STEC): NOT DETECTED
Entamoeba histolytica: NOT DETECTED
Enteroaggregative E coli: NOT DETECTED
Enteropathogenic E coli: NOT DETECTED
G lamblia by PCR: NOT DETECTED
Norovirus GI/GII: NOT DETECTED
Plesiomonas shigelloides: NOT DETECTED
Rotavirus A by PCR: NOT DETECTED
Salmonella by PCR: NOT DETECTED
Sapovirus: NOT DETECTED
Shigella by PCR: NOT DETECTED
Vibrio cholerae: NOT DETECTED
Vibrio: NOT DETECTED
Yersinia enterocolitica: NOT DETECTED

## 2019-02-21 LAB — URINE CULTURE

## 2019-02-26 ENCOUNTER — Encounter: Payer: Self-pay | Admitting: Gastroenterology

## 2021-03-14 ENCOUNTER — Ambulatory Visit
Admission: EM | Admit: 2021-03-14 | Discharge: 2021-03-14 | Discharge status: Against Medical Advice | Payer: BC Managed Care – PPO

## 2021-03-14 ENCOUNTER — Other Ambulatory Visit: Payer: Self-pay

## 2021-08-20 ENCOUNTER — Other Ambulatory Visit: Payer: Self-pay

## 2021-08-20 ENCOUNTER — Ambulatory Visit
Admission: EM | Admit: 2021-08-20 | Discharge: 2021-08-20 | Disposition: A | Discharge status: Home / Self Care | Payer: BC Managed Care – PPO | Attending: Emergency Medicine | Admitting: Emergency Medicine

## 2021-08-20 ENCOUNTER — Encounter: Payer: Self-pay | Admitting: Emergency Medicine

## 2021-08-20 DIAGNOSIS — L03116 Cellulitis of left lower limb: Secondary | ICD-10-CM | POA: Diagnosis not present

## 2021-08-20 MED ORDER — DOXYCYCLINE HYCLATE 100 MG PO CAPS: 100.0000 mg | ORAL_CAPSULE | Freq: Two times a day (BID) | ORAL | 0 refills | Status: DC

## 2021-08-20 NOTE — Discharge Instructions (Signed)
Today you are being treated for an infection of the skin and soft tissues ? ?Take doxycycline twice daily for the next 7 days, you should begin to see improvement after 48 hours of medication use and it should steadily progress ? ?You may continue use of ibuprofen or Tylenol to help with discomfort ? ?You may elevate legs while in sitting or lying positions to help with swelling ? ?You may apply cool or warm compresses to the affected area whichever provides you the most comfort ? ?If you have not begun to see any improvement after 72 hours of medication use please follow-up with urgent care for reevaluation ? ? ?

## 2021-08-20 NOTE — ED Triage Notes (Signed)
Patient states that he noticed some swelling in his left lower leg that started earlier this week.  Patient states that he noticed the redness about 2 days ago.  Patient reports tenderness to the touch.   ?

## 2021-08-20 NOTE — ED Provider Notes (Signed)
?[facility] ? ? ? ?CSN: [id number] ?Arrival date & time: 08/20/21  0800 ? ? ?  ? ?History   ?Chief Complaint ?Chief Complaint  ?Patient presents with  ? Leg Swelling  ?  Left lower leg  ? ? ?HPI ?Caleb Nelson is a 51 y.o. male.  ? ?Patient presents with swelling of the left lower extremity for 3 to 4 days, erythema and warmth to the skin beginning 2 days ago.  Site is painful when changing positions but able to bear weight.  Endorses some discomfort and tingling which he attributes to the swelling.  Endorses that his leg is swollen before and typically resolves with rest .  Has attempted use of ibuprofen which has been somewhat helpful.  Denies fever, chills, drainage, involvement of the calf. ? ? ? ?History reviewed. No pertinent past medical history. ? ?There are no problems to display for this patient. ? ? ?Past Surgical History:  ?Procedure Laterality Date  ? CHOLECYSTECTOMY    ? GASTRIC RESTRICTION SURGERY    ? ? ? ? ? ?Home Medications   ? ?Prior to Admission medications   ?Medication Sig Start Date End Date Taking? Authorizing Provider  ?Multiple Vitamin (MULTI-VITAMINS) TABS Take by mouth.   Yes [provider]  ?amoxicillin-clavulanate (AUGMENTIN) 875-125 MG tablet Take 1 tablet by mouth every 12 (twelve) hours. 11/23/17   Renford Dills, NP  ?chlorpheniramine-HYDROcodone [facility] ER) 10-8 MG/5ML SUER Take 5 mLs by mouth at bedtime as needed for cough. do not drive or operate machinery while taking as can cause drowsiness. 11/20/17   Renford Dills, NP  ?dicyclomine (BENTYL) 20 MG tablet Take 1 tablet (20 mg total) by mouth 3 (three) times daily as needed. 02/19/19   Phineas Semen, MD  ?fluticasone Aleda Grana) 50 MCG/ACT nasal spray Place 1 spray into both nostrils daily. 11/20/17   Renford Dills, NP  ?ondansetron (ZOFRAN ODT) 8 MG disintegrating tablet Take 1 tablet (8 mg total) by mouth every 8 (eight) hours as needed. 02/17/19   Payton Mccallum, MD  ?pantoprazole (PROTONIX)  40 MG tablet Take by mouth. 05/25/17 11/21/17  [provider]  ? ? ?Family History ?History reviewed. No pertinent family history. ? ?Social History ?Social History  ? ?Tobacco Use  ? Smoking status: Never  ? Smokeless tobacco: Never  ?Vaping Use  ? Vaping Use: Some days  ?Substance Use Topics  ? Alcohol use: Never  ? Drug use: Never  ? ? ? ?Allergies   ?Patient has no known allergies. ? ? ?Review of Systems ?Review of Systems  ?Constitutional: Negative.   ?Respiratory: Negative.    ?Cardiovascular: Negative.   ?Skin:  Positive for rash. Negative for color change, pallor and wound.  ?Neurological: Negative.   ? ? ?Physical Exam ?Triage Vital Signs ?ED Triage Vitals  ?Enc Vitals Group  ?   BP 08/20/21 0810 (!) 153/96  ?   Pulse Rate 08/20/21 0810 86  ?   Resp 08/20/21 0810 15  ?   Temp 08/20/21 0810 98.6 ?F (37 ?C)  ?   Temp Source 08/20/21 0810 Oral  ?   SpO2 08/20/21 0810 98 %  ?   Weight 08/20/21 0807 250 lb (113.4 kg)  ?   Height 08/20/21 0807 6' (1.829 m)  ?   Head Circumference --   ?   Peak Flow --   ?   Pain Score 08/20/21 0807 2  ?   Pain Loc --   ?   Pain Edu? --   ?  Excl. in GC? --   ? ?No data found. ? ?Updated Vital Signs ?BP (!) 153/96 (BP Location: Right Arm)   Pulse 86   Temp 98.6 ?F (37 ?C) (Oral)   Resp 15   Ht 6' (1.829 m)   Wt 250 lb (113.4 kg)   SpO2 98%   BMI 33.91 kg/m?  ? ?Visual Acuity ?Right Eye Distance:   ?Left Eye Distance:   ?Bilateral Distance:   ? ?Right Eye Near:   ?Left Eye Near:    ?Bilateral Near:    ? ?Physical Exam ?Constitutional:   ?   Appearance: Normal appearance.  ?HENT:  ?   Head: Normocephalic.  ?Eyes:  ?   Extraocular Movements: Extraocular movements intact.  ?Pulmonary:  ?   Effort: Pulmonary effort is normal.  ?Skin: ?   Comments: 1+ pitting edema to the left lower extremity, erythema and warmth to the medial aspect, able to bear weight, 2+ dorsalis pedis pulse and 2+ popliteal pulse, sensation intact  ?Neurological:  ?   Mental Status: He is alert and  oriented to person, place, and time. Mental status is at baseline.  ?Psychiatric:     ?   Mood and Affect: Mood normal.     ?   Behavior: Behavior normal.  ? ? ? ?UC Treatments / Results  ?Labs ?(all labs ordered are listed, but only abnormal results are displayed) ?Labs Reviewed - No data to display ? ?EKG ? ? ?Radiology ?No results found. ? ?Procedures ?Procedures (including critical care time) ? ?Medications Ordered in UC ?Medications - No data to display ? ?Initial Impression / Assessment and Plan / UC Course  ?I have reviewed the triage vital signs and the nursing notes. ? ?Pertinent labs & imaging results that were available during my care of the patient were reviewed by me and considered in my medical decision making (see chart for details). ? ?Cellulitis of the left lower extremity ? ?Vital signs are stable, patient is okay for outpatient treatment, doxycycline 7-day course prescribed, may continue some Tylenol and ibuprofen, cool warm compresses to the affected area for comfort, may elevate to try to relieve swelling, given strict precautions that if no improvement seen within 72 hours of medication use to follow-up with urgent care for reevaluation, verbalized understanding ?Final Clinical Impressions(s) / UC Diagnoses  ? ?Final diagnoses:  ?None  ? ?Discharge Instructions   ?None ?  ? ?ED Prescriptions   ?None ?  ? ?PDMP not reviewed this encounter. ?  Valinda Hoar, NP ?08/20/21 0973 ? ?

## 2021-08-22 ENCOUNTER — Emergency Department: Payer: BC Managed Care – PPO

## 2021-08-22 ENCOUNTER — Other Ambulatory Visit: Payer: Self-pay

## 2021-08-22 ENCOUNTER — Inpatient Hospital Stay
Admission: EM | Admit: 2021-08-22 | Discharge: 2021-08-24 | DRG: 603 | Disposition: A | Discharge status: Home / Self Care | Payer: BC Managed Care – PPO | Attending: Internal Medicine | Admitting: Internal Medicine

## 2021-08-22 ENCOUNTER — Ambulatory Visit
Admission: EM | Admit: 2021-08-22 | Discharge: 2021-08-22 | Disposition: A | Discharge status: Home / Self Care | Payer: BC Managed Care – PPO

## 2021-08-22 DIAGNOSIS — L03115 Cellulitis of right lower limb: Secondary | ICD-10-CM | POA: Diagnosis present

## 2021-08-22 DIAGNOSIS — I10 Essential (primary) hypertension: Secondary | ICD-10-CM | POA: Diagnosis present

## 2021-08-22 DIAGNOSIS — E1169 Type 2 diabetes mellitus with other specified complication: Secondary | ICD-10-CM | POA: Diagnosis not present

## 2021-08-22 DIAGNOSIS — Z9884 Bariatric surgery status: Secondary | ICD-10-CM | POA: Diagnosis not present

## 2021-08-22 DIAGNOSIS — R21 Rash and other nonspecific skin eruption: Secondary | ICD-10-CM | POA: Diagnosis present

## 2021-08-22 DIAGNOSIS — E119 Type 2 diabetes mellitus without complications: Secondary | ICD-10-CM | POA: Diagnosis present

## 2021-08-22 DIAGNOSIS — M7989 Other specified soft tissue disorders: Principal | ICD-10-CM

## 2021-08-22 DIAGNOSIS — Z888 Allergy status to other drugs, medicaments and biological substances status: Secondary | ICD-10-CM | POA: Diagnosis not present

## 2021-08-22 DIAGNOSIS — L03116 Cellulitis of left lower limb: Secondary | ICD-10-CM | POA: Diagnosis not present

## 2021-08-22 DIAGNOSIS — E1129 Type 2 diabetes mellitus with other diabetic kidney complication: Secondary | ICD-10-CM

## 2021-08-22 DIAGNOSIS — E785 Hyperlipidemia, unspecified: Secondary | ICD-10-CM | POA: Diagnosis present

## 2021-08-22 DIAGNOSIS — D649 Anemia, unspecified: Secondary | ICD-10-CM | POA: Diagnosis present

## 2021-08-22 DIAGNOSIS — K760 Fatty (change of) liver, not elsewhere classified: Secondary | ICD-10-CM | POA: Diagnosis not present

## 2021-08-22 DIAGNOSIS — M722 Plantar fascial fibromatosis: Secondary | ICD-10-CM | POA: Diagnosis present

## 2021-08-22 DIAGNOSIS — L03119 Cellulitis of unspecified part of limb: Secondary | ICD-10-CM | POA: Diagnosis present

## 2021-08-22 LAB — COMPREHENSIVE METABOLIC PANEL
ALT: 22 U/L (ref 0–44)
AST: 21 U/L (ref 15–41)
Albumin: 4.1 g/dL (ref 3.5–5.0)
Alkaline Phosphatase: 88 U/L (ref 38–126)
Anion gap: 8 (ref 5–15)
BUN: 19 mg/dL (ref 6–20)
CO2: 26 mmol/L (ref 22–32)
Calcium: 8.8 mg/dL — ABNORMAL LOW (ref 8.9–10.3)
Chloride: 109 mmol/L (ref 98–111)
Creatinine, Ser: 1.03 mg/dL (ref 0.61–1.24)
GFR, Estimated: >60 mL/min (ref >60)
Glucose, Bld: 109 mg/dL — ABNORMAL HIGH (ref 70–99)
Potassium: 3.7 mmol/L (ref 3.5–5.1)
Sodium: 143 mmol/L (ref 135–145)
Total Bilirubin: 1.3 mg/dL — ABNORMAL HIGH (ref 0.3–1.2)
Total Protein: 8.1 g/dL (ref 6.5–8.1)

## 2021-08-22 LAB — CREATININE, SERUM
Creatinine, Ser: 0.86 mg/dL (ref 0.61–1.24)
GFR, Estimated: >60 mL/min (ref >60)

## 2021-08-22 LAB — CBC WITH DIFFERENTIAL/PLATELET
Abs Immature Granulocytes: 0.04 10*3/uL (ref 0.00–0.07)
Basophils Absolute: 0 10*3/uL (ref 0.0–0.1)
Basophils Relative: 0 %
Eosinophils Absolute: 0.1 10*3/uL (ref 0.0–0.5)
Eosinophils Relative: 1 %
HCT: 44.5 % (ref 39.0–52.0)
Hemoglobin: 14.8 g/dL (ref 13.0–17.0)
Immature Granulocytes: 0 %
Lymphocytes Relative: 13 %
Lymphs Abs: 1.4 10*3/uL (ref 0.7–4.0)
MCH: 27.8 pg (ref 26.0–34.0)
MCHC: 33.3 g/dL (ref 30.0–36.0)
MCV: 83.6 fL (ref 80.0–100.0)
Monocytes Absolute: 0.8 10*3/uL (ref 0.1–1.0)
Monocytes Relative: 8 %
Neutro Abs: 8.1 10*3/uL — ABNORMAL HIGH (ref 1.7–7.7)
Neutrophils Relative %: 78 %
Platelets: 225 10*3/uL (ref 150–400)
RBC: 5.32 MIL/uL (ref 4.22–5.81)
RDW: 13.9 % (ref 11.5–15.5)
WBC: 10.4 10*3/uL (ref 4.0–10.5)
nRBC: 0 % (ref 0.0–0.2)

## 2021-08-22 LAB — CBC
HCT: 40.9 % (ref 39.0–52.0)
Hemoglobin: 13.5 g/dL (ref 13.0–17.0)
MCH: 27.8 pg (ref 26.0–34.0)
MCHC: 33 g/dL (ref 30.0–36.0)
MCV: 84.3 fL (ref 80.0–100.0)
Platelets: 199 10*3/uL (ref 150–400)
RBC: 4.85 MIL/uL (ref 4.22–5.81)
RDW: 13.9 % (ref 11.5–15.5)
WBC: 7.7 10*3/uL (ref 4.0–10.5)
nRBC: 0 % (ref 0.0–0.2)

## 2021-08-22 LAB — HIV ANTIBODY (ROUTINE TESTING W REFLEX): HIV Screen 4th Generation wRfx: NONREACTIVE

## 2021-08-22 LAB — LACTIC ACID, PLASMA
Lactic Acid, Venous: 0.9 mmol/L (ref 0.5–1.9)
Lactic Acid, Venous: 0.8 mmol/L (ref 0.5–1.9)

## 2021-08-22 MED ORDER — SODIUM CHLORIDE 0.9 % IV BOLUS
1000.0000 mL | Freq: Once | INTRAVENOUS | Status: AC
  Administered 2021-08-22: 1000 mL via INTRAVENOUS

## 2021-08-22 MED ORDER — SENNOSIDES-DOCUSATE SODIUM 8.6-50 MG PO TABS: 1.0000 | ORAL_TABLET | Freq: Every evening | ORAL | Status: DC | PRN

## 2021-08-22 MED ORDER — SODIUM CHLORIDE 0.9 % IV SOLN
2.0000 g | Freq: Three times a day (TID) | INTRAVENOUS | Status: DC
  Administered 2021-08-22 – 2021-08-24 (×6): 2 g via INTRAVENOUS
  Filled 2021-08-22 (×8): qty 2

## 2021-08-22 MED ORDER — VANCOMYCIN HCL 2000 MG/400ML IV SOLN
2000.0000 mg | Freq: Once | INTRAVENOUS | Status: AC
  Administered 2021-08-22: 2000 mg via INTRAVENOUS
  Filled 2021-08-22: qty 400

## 2021-08-22 MED ORDER — NICOTINE 21 MG/24HR TD PT24: 21.0000 mg | MEDICATED_PATCH | Freq: Every day | TRANSDERMAL | Status: DC | PRN

## 2021-08-22 MED ORDER — OXYCODONE HCL 5 MG PO TABS: 5.0000 mg | ORAL_TABLET | ORAL | Status: DC | PRN

## 2021-08-22 MED ORDER — BISACODYL 5 MG PO TBEC: 5.0000 mg | DELAYED_RELEASE_TABLET | Freq: Every day | ORAL | Status: DC | PRN

## 2021-08-22 MED ORDER — ENOXAPARIN SODIUM 60 MG/0.6ML IJ SOSY
0.5000 mg/kg | PREFILLED_SYRINGE | INTRAMUSCULAR | Status: DC
  Administered 2021-08-22 – 2021-08-23 (×2): 57.5 mg via SUBCUTANEOUS
  Filled 2021-08-22 (×2): qty 0.6

## 2021-08-22 MED ORDER — SODIUM CHLORIDE 0.9 % IV SOLN: 2.0000 g | Freq: Three times a day (TID) | INTRAVENOUS | Status: DC

## 2021-08-22 MED ORDER — ACETAMINOPHEN 650 MG RE SUPP: 650.0000 mg | Freq: Four times a day (QID) | RECTAL | Status: DC | PRN

## 2021-08-22 MED ORDER — VANCOMYCIN HCL 1250 MG/250ML IV SOLN
1250.0000 mg | Freq: Two times a day (BID) | INTRAVENOUS | Status: DC
  Administered 2021-08-23 (×2): 1250 mg via INTRAVENOUS
  Filled 2021-08-22 (×3): qty 250

## 2021-08-22 MED ORDER — ACETAMINOPHEN 325 MG PO TABS: 650.0000 mg | ORAL_TABLET | Freq: Four times a day (QID) | ORAL | Status: DC | PRN

## 2021-08-22 MED ORDER — ONDANSETRON HCL 4 MG/2ML IJ SOLN: 4.0000 mg | Freq: Four times a day (QID) | INTRAMUSCULAR | Status: DC | PRN

## 2021-08-22 MED ORDER — ONDANSETRON HCL 4 MG PO TABS: 4.0000 mg | ORAL_TABLET | Freq: Four times a day (QID) | ORAL | Status: DC | PRN

## 2021-08-22 MED ORDER — ENOXAPARIN SODIUM 40 MG/0.4ML IJ SOSY: 40.0000 mg | PREFILLED_SYRINGE | INTRAMUSCULAR | Status: DC

## 2021-08-22 NOTE — ED Triage Notes (Signed)
Pt from home,pt with hx of cellulitis and htn. Pt does not take bp meds at this time. Pt with left ankle and calf redness, warmth and swelling. Pt states this has gone on for 3 days and is spreading up his leg. Pt states he felt a pain in his ankle last weekend, then redness started 3 days ago. Pt denies taking any blood thinners.  ?

## 2021-08-22 NOTE — ED Provider Notes (Signed)
? ?[facility] ?Provider Note ? ? ? Event Date/Time  ? First MD Initiated Contact with Patient 08/22/21 1257   ?  (approximate) ? ? ?History  ? ?Chief Complaint ?Leg Swelling ? ? ?HPI ?Caleb Nelson is a 51 y.o. male, history of hypertension and diabetes, presents to the emergency department for evaluation of leg swelling.  Patient states he was diagnosed with cellulitis approximately 3 days ago and provided outpatient treatment with doxycycline.  Since then, patient states that the rash is only getting worse.  His rash started around the ankle region and has progressed up to the mid calf region, despite antibiotic therapy.  In addition, he notices some small blisters forming as well.  Endorses mild to moderate pain.  Patient states that he has had cellulitis infections in the past that are similar to this.  Denies any objective fever, however has been waking up with night sweats.  Denies chest pain, shortness of breath, abdominal pain, back pain, neck pain, headache, nausea/vomiting, urinary symptoms, or weakness/lightheadedness.  Denies any sick contacts, animal exposures, chemical exposures, or sun exposure. ? ?History Limitations: No limitations. ? ?  ? ? ?Physical Exam  ?Triage Vital Signs: ?ED Triage Vitals [08/22/21 1239]  ?Enc Vitals Group  ?   BP (!) 174/109  ?   Pulse Rate 98  ?   Resp 18  ?   Temp 98.2 ?F (36.8 ?C)  ?   Temp Source Oral  ?   SpO2 98 %  ?   Weight 250 lb (113.4 kg)  ?   Height 6' (1.829 m)  ?   Head Circumference   ?   Peak Flow   ?   Pain Score 0  ?   Pain Loc   ?   Pain Edu?   ?   Excl. in GC?   ? ? ?Most recent vital signs: ?Vitals:  ? 08/22/21 1239  ?BP: (!) 174/109  ?Pulse: 98  ?Resp: 18  ?Temp: 98.2 ?F (36.8 ?C)  ?SpO2: 98%  ? ? ?General: Awake, NAD.  ?Skin: Warm, dry.  ?CV: Good peripheral perfusion.  ?Resp: Normal effort.  ?Abd: Soft, non-tender. No distention.  ?Neuro: At baseline. No gross neurological deficits.  ?Other: Erythematous rash present along the  left lower extremity extending from the ankle up to the mid calf region.  Mild edema present. Mild purple discoloration along the ankle with small blisters forming.  No active bleeding or discharge.  Mild tenderness to palpation.  Pulse, motor, sensation intact distally.  No bony tenderness.  Negative Homans' sign. ? ?Physical Exam ? ? ? ?ED Results / Procedures / Treatments  ?Labs ?(all labs ordered are listed, but only abnormal results are displayed) ?Labs Reviewed  ?CBC WITH DIFFERENTIAL/PLATELET - Abnormal; Notable for the following components:  ?    Result Value  ? Neutro Abs 8.1 (*)   ? All other components within normal limits  ?COMPREHENSIVE METABOLIC PANEL - Abnormal; Notable for the following components:  ? Glucose, Bld 109 (*)   ? Calcium 8.8 (*)   ? Total Bilirubin 1.3 (*)   ? All other components within normal limits  ?CULTURE, BLOOD (ROUTINE X 2)  ?CULTURE, BLOOD (ROUTINE X 2)  ?LACTIC ACID, PLASMA  ?LACTIC ACID, PLASMA  ?HIV ANTIBODY (ROUTINE TESTING W REFLEX)  ?CBC  ?CREATININE, SERUM  ? ? ? ?EKG ?Not applicable. ? ? ?RADIOLOGY ? ?ED Provider Interpretation: I personally reviewed and interpreted this x-ray, edema consistent with cellulitis ? ?DG  Tibia/Fibula Left ? ?Result Date: 08/22/2021 ?CLINICAL DATA:  History of cellulitis. Left ankle and calf redness, warmth, and swelling. EXAM: LEFT TIBIA AND FIBULA - 2 VIEW COMPARISON:  None. FINDINGS: There is no evidence of fracture, cortical erosion or other focal bone abnormality. Small posterior calcaneal enthesophytes. Subcutaneous edema is seen throughout the left lower leg and partially visualized dorsal left foot. No subcutaneous emphysema. IMPRESSION: No acute osseous abnormality. Diffuse subcutaneous edema in the lower left leg, consistent with reported clinical history of cellulitis. No subcutaneous emphysema. Electronically Signed   By: Sherron Ales M.D.   On: 08/22/2021 13:39   ? ?PROCEDURES: ? ?Critical Care performed:  None. ? ?Procedures ? ? ? ?MEDICATIONS ORDERED IN ED: ?Medications  ?vancomycin (VANCOREADY) IVPB 2000 mg/400 mL (2,000 mg Intravenous New Bag/Given 08/22/21 1440)  ?acetaminophen (TYLENOL) tablet 650 mg (has no administration in time range)  ?  Or  ?acetaminophen (TYLENOL) suppository 650 mg (has no administration in time range)  ?oxyCODONE (Oxy IR/ROXICODONE) immediate release tablet 5 mg (has no administration in time range)  ?ondansetron (ZOFRAN) tablet 4 mg (has no administration in time range)  ?  Or  ?ondansetron (ZOFRAN) injection 4 mg (has no administration in time range)  ?senna-docusate (Senokot-S) tablet 1 tablet (has no administration in time range)  ?bisacodyl (DULCOLAX) EC tablet 5 mg (has no administration in time range)  ?nicotine (NICODERM CQ - dosed in mg/24 hours) patch 21 mg (has no administration in time range)  ?enoxaparin (LOVENOX) injection 57.5 mg (has no administration in time range)  ?ceFEPIme (MAXIPIME) 2 g in sodium chloride 0.9 % 100 mL IVPB (has no administration in time range)  ?vancomycin (VANCOREADY) IVPB 1250 mg/250 mL (has no administration in time range)  ?sodium chloride 0.9 % bolus 1,000 mL (1,000 mLs Intravenous New Bag/Given 08/22/21 1431)  ? ? ? ?IMPRESSION / MDM / ASSESSMENT AND PLAN / ED COURSE  ?I reviewed the triage vital signs and the nursing notes. ?             ?               ? ? ?Differential diagnosis includes, but is not limited to, cellulitis, excising fasciitis, erysipelas, ? ?ED Course ?Patient appears well.  Hypertensive at 170/109, otherwise normal vitals.  NAD. ? ?CBC shows no leukocytosis or anemia.  CMP shows no evidence of electrolyte abnormalities, transaminitis, or kidney injury. ? ?Tibia/fibula x-ray shows evidence of subcutaneous edema, consistent with cellulitis.  No evidence of subcutaneous emphysema. ? ?Assessment/Plan ?Presentation consistent with cellulitis in the left lower extremity.  Patient has failed outpatient p.o. doxycycline therapy.  He is  currently stable at this time.  Will initiate IV fluids and IV antibiotics.  We will plan to admit for further treatment.  Spoke to the attending hospitalist, who agreed to admission ? ? ?  ? ? ?FINAL CLINICAL IMPRESSION(S) / ED DIAGNOSES  ? ?Final diagnoses:  ?None  ? ? ? ?Rx / DC Orders  ? ?ED Discharge Orders   ? ? None  ? ?  ? ? ? ?Note:  This document was prepared using Dragon voice recognition software and may include unintentional dictation errors. ?  ?Varney Daily, [place] ?08/22/21 1532 ? ?  ?Georga Hacking, MD ?08/22/21 1640 ? ?

## 2021-08-22 NOTE — Consult Note (Signed)
PHARMACY -  BRIEF ANTIBIOTIC NOTE  ? ?Pharmacy has received consult(s) for vancomycin from an ED provider for indication cellulitis.  The patient's profile has been reviewed for ht/wt/allergies/indication/available labs.   ? ?One time order(s) placed for  ?--Vancomycin 2 g IV ? ?Further antibiotics/pharmacy consults should be ordered by admitting physician if indicated.       ?                ?Thank you, ?Tressie Ellis ?08/22/2021  2:07 PM  ?

## 2021-08-22 NOTE — Progress Notes (Signed)
Pharmacy Antibiotic Note ? ?Caleb Nelson is a 51 y.o. male admitted on 08/22/2021 with cellulitis.  Pharmacy has been consulted for vancomycin and cefepime dosing for 7 days. ? ?Plan: ?Cefepime 2 g IV Q8H ?Pt received vancomycin 2 g IV x 1 loading dose. Will continue with 1250 mg IV Q12H ?Est AUC: 545 ?Used: Scr 1.03, IBW, Vd 0.5 ?Obtain vanc levels around 4th or 5th dose if continued ?Monitor renal function and adjust dose as clinically indicated ? ?Height: 6' (182.9 cm) ?Weight: 113.4 kg (250 lb) ?IBW/kg (Calculated) : 77.6 ? ?Temp (24hrs), Avg:98.3 ?F (36.8 ?C), Min:98.2 ?F (36.8 ?C), Max:98.3 ?F (36.8 ?C) ? ?Recent Labs  ?Lab 08/22/21 ?1240  ?WBC 10.4  ?CREATININE 1.03  ?  ?Estimated Creatinine Clearance: 109.1 mL/min (by C-G formula based on SCr of 1.03 mg/dL).   ? ?No Known Allergies ? ?Antimicrobials this admission: ?4/2 cefepime >>  ?4/2 vancomycin >>  ? ?Microbiology results: ?4/2 BCx: sent ? ?Thank you for allowing pharmacy to be a part of this patient?s care. ? ?Robyne Peers Caleb Nelson ?08/22/2021 2:55 PM ? ?

## 2021-08-22 NOTE — Assessment & Plan Note (Addendum)
-   Follow-up A1c ?- Treatment pending results ?-Use SSI for now ?

## 2021-08-22 NOTE — Assessment & Plan Note (Addendum)
-   Patient reports worsening of his leg after 3 days of Doxy although still may not have been long enough of a trial.  Regardless, antibiotics broadened on admission to vancomycin and cefepime.  Reasonable considering he has not been on any diabetes medications for the past few years and unknown A1c ?-De-escalate as soon as possible; hoping for d/c tomorrow ?- Left lower extremity duplex obtained this morning negative for DVT ?- follow up A1c ?

## 2021-08-22 NOTE — Progress Notes (Signed)
PHARMACIST - PHYSICIAN COMMUNICATION ? ?CONCERNING:  Enoxaparin (Lovenox) for DVT Prophylaxis  ? ? ?RECOMMENDATION: ?Patient was prescribed enoxaprin 40mg  q24 hours for VTE prophylaxis.  ? ?Filed Weights  ? 08/22/21 1239  ?Weight: 113.4 kg (250 lb)  ? ? ?Body mass index is 33.91 kg/m?. ? ?Estimated Creatinine Clearance: 109.1 mL/min (by C-G formula based on SCr of 1.03 mg/dL). ? ? ?Based on [facility] policy patient is candidate for enoxaparin 0.5mg /kg TBW SQ every 24 hours based on BMI being >30. ? ?DESCRIPTION: ?Pharmacy has adjusted enoxaparin dose per [facility] policy. ? ?Patient is now receiving enoxaparin 57.5 mg every 24 hours  ? ? ?[facility], PharmD ?Clinical Pharmacist  ?08/22/2021 ?2:40 PM ? ?

## 2021-08-22 NOTE — ED Notes (Signed)
Patient ambulated to hallway bathroom with steady gait. Patient brought own clothes to change in to. ?

## 2021-08-22 NOTE — ED Triage Notes (Signed)
Last seen on 3/31 for LLE swelling tx with doxy. Pt reports since last OV, sxs have worsened with blistering, change in color and increase in size. Notes pain with starting to walk such as getting out of bed. Sxs decrease some as he walks. Pt compares the pain to a "tight sunburn". ?

## 2021-08-22 NOTE — Assessment & Plan Note (Addendum)
-   CHL 78, LDL 39 ?- can hold off on statin for now given low CHL ?

## 2021-08-22 NOTE — Assessment & Plan Note (Addendum)
-   BP uncontrolled on admission, 174/109 ?- start lisinopril and adjust as necessary ?

## 2021-08-22 NOTE — ED Notes (Signed)
Spoke to pharmacy on meds and checked micromedics ?

## 2021-08-22 NOTE — ED Provider Notes (Signed)
Patient here today for evaluation of worsening erythema that seems to be spreading with new blisters noted to left lower leg after being evaluated and treated for cellulitis a few days ago. He has been taking doxycycline without improvement. He has not checked his temperature but states he has been waking in a sweat the last few nights. I recommended further evaluation in the ED as he may need IV antibiotics, stat labs and imaging at this point. Patient is agreeable with this plan and will report to [facility] after leaving this office.  ?  ?Tomi Bamberger, PA-C ?08/22/21 1201 ? ?

## 2021-08-22 NOTE — H&P (Signed)
? ? ?HISTORY AND PHYSICAL ? ?Patient: Caleb Nelson 51 y.o. male ?MRN: [id number] ? ?Today is hospital day 0 after presenting to ED on 08/22/2021 12:46 PM with  ?Chief Complaint  ?Patient presents with  ? Leg Swelling  ? ? ? ?RECORD REVIEW AND HOSPITAL COURSE: ?From ED notes 08/22/21: "Caleb Nelson is a 51 y.o. male, history of hypertension and diabetes, presents to the emergency department for evaluation of leg swelling.  Patient states he was diagnosed with cellulitis approximately 3 days ago and provided outpatient treatment with doxycycline.  Since then, patient states that the rash is only getting worse.  His rash started around the ankle region and has progressed up to the mid calf region, despite antibiotic therapy.  In addition, he notices some small blisters forming as well.  Endorses mild to moderate pain.  Patient states that he has had cellulitis infections in the past that are similar to this.  Denies any objective fever, however has been waking up with night sweats.  Denies chest pain, shortness of breath, abdominal pain, back pain, neck pain, headache, nausea/vomiting, urinary symptoms, or weakness/lightheadedness.  Denies any sick contacts, animal exposures, chemical exposures, or sun exposure." ? ?Procedures and Significant Results:  ?none ? ?Consultants:  ?none ? ? ? ?SUBJECTIVE:  ?Patient seen and examined resting comfortably in bed in emergency department, confirms above history.  Confirms no current outpatient prescriptions, he has a history of diabetes but was taken off of his medications a while ago. ? ? ? ? ?ASSESSMENT & PLAN ? ?Cellulitis of right lower extremity ?Failed outpatient antibiotic treatment ?Initiated IV antibiotics for 07/12/2021 with vancomycin and cefepime ?See photos ? ?Benign essential hypertension ?Starting po medications ? ?Dyslipidemia ?Lipid panel pending ? ?Type 2 diabetes mellitus ([place]) ?A1C pending - will likely need treatment ? ? ? ? ? ? ? ? ? ?VTE Ppx: Lovenox ?CODE  STATUS: FULL ?Admitted from: home ?Expected Dispo: home ?Barriers to discharge: IV antibiotics  ?Family communication: pt declines at thist iem  ? ? ? ? ? ? ? ? ? ? ? ? ? ?Past Medical History:  ?Diagnosis Date  ? Diabetes mellitus without complication ([place])   ? Hypertension   ? ? ?Past Surgical History:  ?Procedure Laterality Date  ? CHOLECYSTECTOMY    ? GASTRIC RESTRICTION SURGERY    ? ? ?History reviewed. No pertinent family history. ?Social History:  reports that he has never smoked. He has never used smokeless tobacco. He reports that he does not drink alcohol and does not use drugs. ? ?Allergies:  ?Allergies  ?Allergen Reactions  ? Pioglitazone Other (See Comments)  ?  Alopecia ?Alopecia ?  ? ? ?Medications Prior to Admission  ?Medication Sig Dispense Refill  ? doxycycline (VIBRAMYCIN) 100 MG capsule Take 1 capsule (100 mg total) by mouth 2 (two) times daily. 14 capsule 0  ? dicyclomine (BENTYL) 20 MG tablet Take 1 tablet (20 mg total) by mouth 3 (three) times daily as needed. (Patient not taking: Reported on 08/22/2021) 30 tablet 0  ? fluticasone (FLONASE) 50 MCG/ACT nasal spray Place 1 spray into both nostrils daily. (Patient not taking: Reported on 08/22/2021) 1 g 0  ? Multiple Vitamin (MULTI-VITAMINS) TABS Take by mouth. (Patient not taking: Reported on 08/22/2021)    ? ondansetron (ZOFRAN ODT) 8 MG disintegrating tablet Take 1 tablet (8 mg total) by mouth every 8 (eight) hours as needed. (Patient not taking: Reported on 08/22/2021) 6 tablet 0  ? pantoprazole (PROTONIX) 40 MG tablet Take  by mouth. (Patient not taking: Reported on 08/22/2021)    ? ? ?Results for orders placed or performed during the hospital encounter of 08/22/21 (from the past 48 hour(s))  ?CBC with Differential     Status: Abnormal  ? Collection Time: 08/22/21 12:40 PM  ?Result Value Ref Range  ? WBC 10.4 4.0 - 10.5 K/uL  ? RBC 5.32 4.22 - 5.81 MIL/uL  ? Hemoglobin 14.8 13.0 - 17.0 g/dL  ? HCT 44.5 39.0 - 52.0 %  ? MCV 83.6 80.0 - 100.0 fL  ? MCH  27.8 26.0 - 34.0 pg  ? MCHC 33.3 30.0 - 36.0 g/dL  ? RDW 13.9 11.5 - 15.5 %  ? Platelets 225 150 - 400 K/uL  ? nRBC 0.0 0.0 - 0.2 %  ? Neutrophils Relative % 78 %  ? Neutro Abs 8.1 (H) 1.7 - 7.7 K/uL  ? Lymphocytes Relative 13 %  ? Lymphs Abs 1.4 0.7 - 4.0 K/uL  ? Monocytes Relative 8 %  ? Monocytes Absolute 0.8 0.1 - 1.0 K/uL  ? Eosinophils Relative 1 %  ? Eosinophils Absolute 0.1 0.0 - 0.5 K/uL  ? Basophils Relative 0 %  ? Basophils Absolute 0.0 0.0 - 0.1 K/uL  ? Immature Granulocytes 0 %  ? Abs Immature Granulocytes 0.04 0.00 - 0.07 K/uL  ?  Comment: Performed at [facility], [address], [place], [place] [address]  ?Comprehensive metabolic panel     Status: Abnormal  ? Collection Time: 08/22/21 12:40 PM  ?Result Value Ref Range  ? Sodium 143 135 - 145 mmol/L  ? Potassium 3.7 3.5 - 5.1 mmol/L  ? Chloride 109 98 - 111 mmol/L  ? CO2 26 22 - 32 mmol/L  ? Glucose, Bld 109 (H) 70 - 99 mg/dL  ?  Comment: Glucose reference range applies only to samples taken after fasting for at least 8 hours.  ? BUN 19 6 - 20 mg/dL  ? Creatinine, Ser 1.03 0.61 - 1.24 mg/dL  ? Calcium 8.8 (L) 8.9 - 10.3 mg/dL  ? Total Protein 8.1 6.5 - 8.1 g/dL  ? Albumin 4.1 3.5 - 5.0 g/dL  ? AST 21 15 - 41 U/L  ? ALT 22 0 - 44 U/L  ? Alkaline Phosphatase 88 38 - 126 U/L  ? Total Bilirubin 1.3 (H) 0.3 - 1.2 mg/dL  ? GFR, Estimated >60 >60 mL/min  ?  Comment: (NOTE) ?Calculated using the CKD-EPI Creatinine Equation (2021) ?  ? Anion gap 8 5 - 15  ?  Comment: Performed at [facility], [address], [place], [place] [address]  ?Lactic acid, plasma     Status: None  ? Collection Time: 08/22/21  2:17 PM  ?Result Value Ref Range  ? Lactic Acid, Venous 0.9 0.5 - 1.9 mmol/L  ?  Comment: Performed at [facility], [address] [place], [place] [address]  ?Lactic acid, plasma     Status: None  ? Collection Time: 08/22/21  4:10 PM  ?Result Value Ref Range  ? Lactic Acid, Venous 0.8 0.5 - 1.9 mmol/L  ?  Comment: Performed at  [facility], [address] [place], [place] [address]  ?CBC     Status: None  ? Collection Time: 08/22/21  4:10 PM  ?Result Value Ref Range  ? WBC 7.7 4.0 - 10.5 K/uL  ? RBC 4.85 4.22 - 5.81 MIL/uL  ? Hemoglobin 13.5 13.0 - 17.0 g/dL  ? HCT 40.9 39.0 - 52.0 %  ?  MCV 84.3 80.0 - 100.0 fL  ? MCH 27.8 26.0 - 34.0 pg  ? MCHC 33.0 30.0 - 36.0 g/dL  ? RDW 13.9 11.5 - 15.5 %  ? Platelets 199 150 - 400 K/uL  ? nRBC 0.0 0.0 - 0.2 %  ?  Comment: Performed at [facility], [address], [place], [place] [address]  ?Creatinine, serum     Status: None  ? Collection Time: 08/22/21  4:10 PM  ?Result Value Ref Range  ? Creatinine, Ser 0.86 0.61 - 1.24 mg/dL  ? GFR, Estimated >60 >60 mL/min  ?  Comment: (NOTE) ?Calculated using the CKD-EPI Creatinine Equation (2021) ?Performed at [facility], [place], ?[place] [address] ?  ? ?DG Tibia/Fibula Left ? ?Result Date: 08/22/2021 ?CLINICAL DATA:  History of cellulitis. Left ankle and calf redness, warmth, and swelling. EXAM: LEFT TIBIA AND FIBULA - 2 VIEW COMPARISON:  None. FINDINGS: There is no evidence of fracture, cortical erosion or other focal bone abnormality. Small posterior calcaneal enthesophytes. Subcutaneous edema is seen throughout the left lower leg and partially visualized dorsal left foot. No subcutaneous emphysema. IMPRESSION: No acute osseous abnormality. Diffuse subcutaneous edema in the lower left leg, consistent with reported clinical history of cellulitis. No subcutaneous emphysema. Electronically Signed   By: Ileana Roup M.D.   On: 08/22/2021 13:39   ? ?Review of Systems  ?Constitutional:  Negative for chills and fever.  ?Respiratory:  Negative for chest tightness and shortness of breath.   ?Cardiovascular:  Positive for leg swelling. Negative for chest pain and palpitations.  ?Gastrointestinal:  Negative for abdominal pain.  ?Genitourinary:  Negative for difficulty urinating.  ?Musculoskeletal:  Positive for  myalgias.  ?Psychiatric/Behavioral:  Negative for behavioral problems.   ? ?Blood pressure (!) 179/97, pulse 97, temperature 98.6 ?F (37 ?C), resp. rate 18, height 6' (1.829 m), weight 113.4 kg, SpO2 100 %. ?Phy

## 2021-08-23 ENCOUNTER — Inpatient Hospital Stay: Payer: BC Managed Care – PPO

## 2021-08-23 DIAGNOSIS — D649 Anemia, unspecified: Secondary | ICD-10-CM | POA: Diagnosis not present

## 2021-08-23 DIAGNOSIS — I10 Essential (primary) hypertension: Secondary | ICD-10-CM | POA: Diagnosis not present

## 2021-08-23 DIAGNOSIS — L03115 Cellulitis of right lower limb: Secondary | ICD-10-CM | POA: Diagnosis not present

## 2021-08-23 DIAGNOSIS — E1169 Type 2 diabetes mellitus with other specified complication: Secondary | ICD-10-CM | POA: Diagnosis not present

## 2021-08-23 LAB — BASIC METABOLIC PANEL
Anion gap: 6 (ref 5–15)
BUN: 14 mg/dL (ref 6–20)
CO2: 24 mmol/L (ref 22–32)
Calcium: 8.3 mg/dL — ABNORMAL LOW (ref 8.9–10.3)
Chloride: 109 mmol/L (ref 98–111)
Creatinine, Ser: 0.82 mg/dL (ref 0.61–1.24)
GFR, Estimated: >60 mL/min (ref >60)
Glucose, Bld: 103 mg/dL — ABNORMAL HIGH (ref 70–99)
Potassium: 3.9 mmol/L (ref 3.5–5.1)
Sodium: 139 mmol/L (ref 135–145)

## 2021-08-23 LAB — VITAMIN B12: Vitamin B-12: 321 pg/mL (ref 180–914)

## 2021-08-23 LAB — MRSA NEXT GEN BY PCR, NASAL: MRSA by PCR Next Gen: NOT DETECTED

## 2021-08-23 LAB — CBC
HCT: 38 % — ABNORMAL LOW (ref 39.0–52.0)
Hemoglobin: 12.7 g/dL — ABNORMAL LOW (ref 13.0–17.0)
MCH: 28 pg (ref 26.0–34.0)
MCHC: 33.4 g/dL (ref 30.0–36.0)
MCV: 83.7 fL (ref 80.0–100.0)
Platelets: 209 10*3/uL (ref 150–400)
RBC: 4.54 MIL/uL (ref 4.22–5.81)
RDW: 13.9 % (ref 11.5–15.5)
WBC: 6.7 10*3/uL (ref 4.0–10.5)
nRBC: 0 % (ref 0.0–0.2)

## 2021-08-23 LAB — LIPID PANEL
Cholesterol: 78 mg/dL (ref 0–200)
HDL: 27 mg/dL — ABNORMAL LOW (ref >40)
LDL Cholesterol: 39 mg/dL (ref 0–99)
Total CHOL/HDL Ratio: 2.9 RATIO
Triglycerides: 58 mg/dL (ref <150)
VLDL: 12 mg/dL (ref 0–40)

## 2021-08-23 LAB — FERRITIN: Ferritin: 117 ng/mL (ref 24–336)

## 2021-08-23 LAB — FOLATE: Folate: 7.9 ng/mL (ref >5.9)

## 2021-08-23 LAB — IRON AND TIBC
Iron: 69 ug/dL (ref 45–182)
Saturation Ratios: 31 % (ref 17.9–39.5)
TIBC: 225 ug/dL — ABNORMAL LOW (ref 250–450)
UIBC: 156 ug/dL

## 2021-08-23 LAB — GLUCOSE, CAPILLARY
Glucose-Capillary: 140 mg/dL — ABNORMAL HIGH (ref 70–99)
Glucose-Capillary: 93 mg/dL (ref 70–99)

## 2021-08-23 MED ORDER — LISINOPRIL 20 MG PO TABS
20.0000 mg | ORAL_TABLET | Freq: Every day | ORAL | Status: DC
  Administered 2021-08-23 – 2021-08-24 (×2): 20 mg via ORAL
  Filled 2021-08-23 (×2): qty 1

## 2021-08-23 MED ORDER — VANCOMYCIN HCL 1.25 G IV SOLR
1250.0000 mg | Freq: Two times a day (BID) | INTRAVENOUS | Status: DC
  Administered 2021-08-24: 1250 mg via INTRAVENOUS
  Filled 2021-08-23 (×2): qty 25

## 2021-08-23 MED ORDER — INSULIN ASPART 100 UNIT/ML IJ SOLN: 0.0000 [IU] | Freq: Every day | INTRAMUSCULAR | Status: DC

## 2021-08-23 MED ORDER — INSULIN ASPART 100 UNIT/ML IJ SOLN
0.0000 [IU] | Freq: Three times a day (TID) | INTRAMUSCULAR | Status: DC
  Administered 2021-08-23: 2 [IU] via SUBCUTANEOUS
  Filled 2021-08-23: qty 1

## 2021-08-23 NOTE — Plan of Care (Signed)

## 2021-08-23 NOTE — Assessment & Plan Note (Signed)
-   Patient has history of Roux-en-Y ?-Check iron stores and folate, B12 ?

## 2021-08-23 NOTE — Plan of Care (Signed)

## 2021-08-23 NOTE — Hospital Course (Signed)
Caleb Nelson is a 51 year old male with PMH HTN, DM II who presented with left lower extremity redness, swelling, pain, and a blister.  He had been on outpatient doxycycline for approximately 3 days and stated that his symptoms were worsening with appearance of the blister.  Thus, he presented to the ER for further evaluation. ?He has not seen his doctor in approximately 3 years and is on no medications for his chronic diagnoses. ?X-ray of left leg on admission showed subcutaneous edema in the left lower leg consistent with presumed cellulitis. ?He then underwent left lower extremity duplex the following morning which was also negative for DVT. ?He was started on vancomycin and cefepime on admission. ?

## 2021-08-23 NOTE — Progress Notes (Signed)
?Progress Note ? ? ? ?Caleb Nelson   ?IEP:[id number]  ?DOB: 1971-03-22  ?DOA: 08/22/2021     1 ?PCP: Rayetta Humphrey, MD ? ?Initial CC: Left leg pain and swelling/redness ? ?Hospital Course: ?Caleb Nelson is a 51 year old male with PMH HTN, DM II who presented with left lower extremity redness, swelling, pain, and a blister.  He had been on outpatient doxycycline for approximately 3 days and stated that his symptoms were worsening with appearance of the blister.  Thus, he presented to the ER for further evaluation. ?He has not seen his doctor in approximately 3 years and is on no medications for his chronic diagnoses. ?X-ray of left leg on admission showed subcutaneous edema in the left lower leg consistent with presumed cellulitis. ?He then underwent left lower extremity duplex the following morning which was also negative for DVT. ?He was started on vancomycin and cefepime on admission. ? ?Interval History:  ?Seen in room this morning.  Blister noted on anterior surface of his lower leg as well as associated redness, swelling, and tenderness.  He endorses feeling worse after 3 days of doxycycline at home and came to the ER. ? ?Assessment and Plan: ?* Cellulitis of right lower extremity ?- Patient reports worsening of his leg after 3 days of Doxy although still may not have been long enough of a trial.  Regardless, antibiotics broadened on admission to vancomycin and cefepime.  Reasonable considering he has not been on any diabetes medications for the past few years and unknown A1c ?-De-escalate as soon as possible; hoping for d/c tomorrow ?- Left lower extremity duplex obtained this morning negative for DVT ?- follow up A1c ? ?Normocytic anemia ?- Patient has history of Roux-en-Y ?-Check iron stores and folate, B12 ? ?Type 2 diabetes mellitus ([facility]) ?- Follow-up A1c ?- Treatment pending results ?-Use SSI for now ? ?Dyslipidemia ?- CHL 78, LDL 39 ?- can hold off on statin for now given low CHL ? ?Benign essential  hypertension ?- BP uncontrolled on admission, 174/109 ?- start lisinopril and adjust as necessary ? ? ? ? ? ? ?Old records reviewed in assessment of this patient ? ?Antimicrobials: ?Vancomycin 08/22/2021 >> current ?Cefepime 08/22/2021 >> current ? ?DVT prophylaxis:  ? ?  Lovenox ? ?Code Status:   Code Status: Full Code ? ?Disposition Plan: Home Tuesday ?Status is: Inpatient ? ?Objective: ?Blood pressure (!) 141/86, pulse 75, temperature 98.4 ?F (36.9 ?C), resp. rate 15, height 6' (1.829 m), weight 113.4 kg, SpO2 97 %.  ?Examination:  ?Physical Exam ?Constitutional:   ?   General: He is not in acute distress. ?   Appearance: Normal appearance.  ?HENT:  ?   Head: Normocephalic and atraumatic.  ?   Mouth/Throat:  ?   Mouth: Mucous membranes are moist.  ?Eyes:  ?   Extraocular Movements: Extraocular movements intact.  ?Cardiovascular:  ?   Rate and Rhythm: Normal rate and regular rhythm.  ?   Heart sounds: Normal heart sounds.  ?Pulmonary:  ?   Effort: Pulmonary effort is normal. No respiratory distress.  ?   Breath sounds: Normal breath sounds. No wheezing.  ?Abdominal:  ?   General: Bowel sounds are normal. There is no distension.  ?   Palpations: Abdomen is soft.  ?   Tenderness: There is no abdominal tenderness.  ?Musculoskeletal:  ?   Cervical back: Normal range of motion and neck supple.  ?   Comments: Left leg noted with erythema, edema, tenderness and small 2 mm  blister on anterior surface of lower leg  ?Skin: ?   General: Skin is warm and dry.  ?Neurological:  ?   General: No focal deficit present.  ?   Mental Status: He is alert.  ?Psychiatric:     ?   Mood and Affect: Mood normal.     ?   Behavior: Behavior normal.  ?  ? ?Consultants:  ? ? ?Procedures:  ? ? ?Data Reviewed: ?Results for orders placed or performed during the hospital encounter of 08/22/21 (from the past 24 hour(s))  ?CBC with Differential     Status: Abnormal  ? Collection Time: 08/22/21 12:40 PM  ?Result Value Ref Range  ? WBC 10.4 4.0 - 10.5  K/uL  ? RBC 5.32 4.22 - 5.81 MIL/uL  ? Hemoglobin 14.8 13.0 - 17.0 g/dL  ? HCT 44.5 39.0 - 52.0 %  ? MCV 83.6 80.0 - 100.0 fL  ? MCH 27.8 26.0 - 34.0 pg  ? MCHC 33.3 30.0 - 36.0 g/dL  ? RDW 13.9 11.5 - 15.5 %  ? Platelets 225 150 - 400 K/uL  ? nRBC 0.0 0.0 - 0.2 %  ? Neutrophils Relative % 78 %  ? Neutro Abs 8.1 (H) 1.7 - 7.7 K/uL  ? Lymphocytes Relative 13 %  ? Lymphs Abs 1.4 0.7 - 4.0 K/uL  ? Monocytes Relative 8 %  ? Monocytes Absolute 0.8 0.1 - 1.0 K/uL  ? Eosinophils Relative 1 %  ? Eosinophils Absolute 0.1 0.0 - 0.5 K/uL  ? Basophils Relative 0 %  ? Basophils Absolute 0.0 0.0 - 0.1 K/uL  ? Immature Granulocytes 0 %  ? Abs Immature Granulocytes 0.04 0.00 - 0.07 K/uL  ?Comprehensive metabolic panel     Status: Abnormal  ? Collection Time: 08/22/21 12:40 PM  ?Result Value Ref Range  ? Sodium 143 135 - 145 mmol/L  ? Potassium 3.7 3.5 - 5.1 mmol/L  ? Chloride 109 98 - 111 mmol/L  ? CO2 26 22 - 32 mmol/L  ? Glucose, Bld 109 (H) 70 - 99 mg/dL  ? BUN 19 6 - 20 mg/dL  ? Creatinine, Ser 1.03 0.61 - 1.24 mg/dL  ? Calcium 8.8 (L) 8.9 - 10.3 mg/dL  ? Total Protein 8.1 6.5 - 8.1 g/dL  ? Albumin 4.1 3.5 - 5.0 g/dL  ? AST 21 15 - 41 U/L  ? ALT 22 0 - 44 U/L  ? Alkaline Phosphatase 88 38 - 126 U/L  ? Total Bilirubin 1.3 (H) 0.3 - 1.2 mg/dL  ? GFR, Estimated >60 >60 mL/min  ? Anion gap 8 5 - 15  ?Culture, blood (routine x 2)     Status: None (Preliminary result)  ? Collection Time: 08/22/21  2:05 PM  ? Specimen: BLOOD  ?Result Value Ref Range  ? Specimen Description BLOOD RIGHT ANTECUBITAL   ? Special Requests    ?  BOTTLES DRAWN AEROBIC AND ANAEROBIC Blood Culture results may not be optimal due to an excessive volume of blood received in culture bottles  ? Culture    ?  NO GROWTH < 24 HOURS ?Performed at [facility], [address] [place], [place] [id number] ?  ? Report Status PENDING   ?Culture, blood (routine x 2)     Status: None (Preliminary result)  ? Collection Time: 08/22/21  2:10 PM  ? Specimen: BLOOD   ?Result Value Ref Range  ? Specimen Description BLOOD LEFT ANTECUBITAL   ? Special Requests    ?  BOTTLES DRAWN  AEROBIC AND ANAEROBIC Blood Culture results may not be optimal due to an excessive volume of blood received in culture bottles  ? Culture    ?  NO GROWTH < 24 HOURS ?Performed at [facility], [address], [place], [place] [address] ?  ? Report Status PENDING   ?Lactic acid, plasma     Status: None  ? Collection Time: 08/22/21  2:17 PM  ?Result Value Ref Range  ? Lactic Acid, Venous 0.9 0.5 - 1.9 mmol/L  ?Lactic acid, plasma     Status: None  ? Collection Time: 08/22/21  4:10 PM  ?Result Value Ref Range  ? Lactic Acid, Venous 0.8 0.5 - 1.9 mmol/L  ?HIV Antibody (routine testing w rflx)     Status: None  ? Collection Time: 08/22/21  4:10 PM  ?Result Value Ref Range  ? HIV Screen 4th Generation wRfx Non Reactive Non Reactive  ?CBC     Status: None  ? Collection Time: 08/22/21  4:10 PM  ?Result Value Ref Range  ? WBC 7.7 4.0 - 10.5 K/uL  ? RBC 4.85 4.22 - 5.81 MIL/uL  ? Hemoglobin 13.5 13.0 - 17.0 g/dL  ? HCT 40.9 39.0 - 52.0 %  ? MCV 84.3 80.0 - 100.0 fL  ? MCH 27.8 26.0 - 34.0 pg  ? MCHC 33.0 30.0 - 36.0 g/dL  ? RDW 13.9 11.5 - 15.5 %  ? Platelets 199 150 - 400 K/uL  ? nRBC 0.0 0.0 - 0.2 %  ?Creatinine, serum     Status: None  ? Collection Time: 08/22/21  4:10 PM  ?Result Value Ref Range  ? Creatinine, Ser 0.86 0.61 - 1.24 mg/dL  ? GFR, Estimated >60 >60 mL/min  ?Basic metabolic panel     Status: Abnormal  ? Collection Time: 08/23/21  6:49 AM  ?Result Value Ref Range  ? Sodium 139 135 - 145 mmol/L  ? Potassium 3.9 3.5 - 5.1 mmol/L  ? Chloride 109 98 - 111 mmol/L  ? CO2 24 22 - 32 mmol/L  ? Glucose, Bld 103 (H) 70 - 99 mg/dL  ? BUN 14 6 - 20 mg/dL  ? Creatinine, Ser 0.82 0.61 - 1.24 mg/dL  ? Calcium 8.3 (L) 8.9 - 10.3 mg/dL  ? GFR, Estimated >60 >60 mL/min  ? Anion gap 6 5 - 15  ?CBC     Status: Abnormal  ? Collection Time: 08/23/21  6:49 AM  ?Result Value Ref Range  ? WBC 6.7 4.0 - 10.5 K/uL   ? RBC 4.54 4.22 - 5.81 MIL/uL  ? Hemoglobin 12.7 (L) 13.0 - 17.0 g/dL  ? HCT 38.0 (L) 39.0 - 52.0 %  ? MCV 83.7 80.0 - 100.0 fL  ? MCH 28.0 26.0 - 34.0 pg  ? MCHC 33.4 30.0 - 36.0 g/dL  ? RDW 13.9 11.5 - 15.5 %  ? Pl

## 2021-08-24 LAB — GLUCOSE, CAPILLARY: Glucose-Capillary: 101 mg/dL — ABNORMAL HIGH (ref 70–99)

## 2021-08-24 LAB — CBC WITH DIFFERENTIAL/PLATELET
Abs Immature Granulocytes: 0.02 10*3/uL (ref 0.00–0.07)
Basophils Absolute: 0.1 10*3/uL (ref 0.0–0.1)
Basophils Relative: 1 %
Eosinophils Absolute: 0.2 10*3/uL (ref 0.0–0.5)
Eosinophils Relative: 3 %
HCT: 38.5 % — ABNORMAL LOW (ref 39.0–52.0)
Hemoglobin: 12.7 g/dL — ABNORMAL LOW (ref 13.0–17.0)
Immature Granulocytes: 0 %
Lymphocytes Relative: 20 %
Lymphs Abs: 1.2 10*3/uL (ref 0.7–4.0)
MCH: 27.5 pg (ref 26.0–34.0)
MCHC: 33 g/dL (ref 30.0–36.0)
MCV: 83.3 fL (ref 80.0–100.0)
Monocytes Absolute: 0.6 10*3/uL (ref 0.1–1.0)
Monocytes Relative: 10 %
Neutro Abs: 3.8 10*3/uL (ref 1.7–7.7)
Neutrophils Relative %: 66 %
Platelets: 230 10*3/uL (ref 150–400)
RBC: 4.62 MIL/uL (ref 4.22–5.81)
RDW: 13.5 % (ref 11.5–15.5)
WBC: 5.8 10*3/uL (ref 4.0–10.5)
nRBC: 0 % (ref 0.0–0.2)

## 2021-08-24 LAB — BASIC METABOLIC PANEL
Anion gap: 7 (ref 5–15)
BUN: 14 mg/dL (ref 6–20)
CO2: 25 mmol/L (ref 22–32)
Calcium: 8.3 mg/dL — ABNORMAL LOW (ref 8.9–10.3)
Chloride: 108 mmol/L (ref 98–111)
Creatinine, Ser: 0.76 mg/dL (ref 0.61–1.24)
GFR, Estimated: >60 mL/min (ref >60)
Glucose, Bld: 103 mg/dL — ABNORMAL HIGH (ref 70–99)
Potassium: 3.6 mmol/L (ref 3.5–5.1)
Sodium: 140 mmol/L (ref 135–145)

## 2021-08-24 LAB — MAGNESIUM: Magnesium: 1.9 mg/dL (ref 1.7–2.4)

## 2021-08-24 MED ORDER — CEPHALEXIN 500 MG PO CAPS
500.0000 mg | ORAL_CAPSULE | Freq: Four times a day (QID) | ORAL | Status: DC
  Administered 2021-08-24: 500 mg via ORAL
  Filled 2021-08-24: qty 1

## 2021-08-24 MED ORDER — LISINOPRIL 20 MG PO TABS: 20.0000 mg | ORAL_TABLET | Freq: Every day | ORAL | 1 refills | Status: DC

## 2021-08-24 MED ORDER — CEPHALEXIN 500 MG PO CAPS: 500.0000 mg | ORAL_CAPSULE | Freq: Four times a day (QID) | ORAL | 0 refills | Status: AC

## 2021-08-24 NOTE — Plan of Care (Signed)
  Problem: Education: Goal: Knowledge of General Education information will improve Description: Including pain rating scale, medication(s)/side effects and non-pharmacologic comfort measures Outcome: Progressing   Problem: Clinical Measurements: Goal: Ability to maintain clinical measurements within normal limits will improve Outcome: Progressing Goal: Diagnostic test results will improve Outcome: Progressing   

## 2021-08-24 NOTE — Discharge Instructions (Addendum)
Keep left leg elevated to help decrease swelling ?

## 2021-08-24 NOTE — Progress Notes (Signed)
Patient was given verbal and written discharge instructions, he acknowledges understanding and states he will comply with medication regimen,no pain noted when leaving the unit. ?

## 2021-08-24 NOTE — Plan of Care (Signed)

## 2021-08-24 NOTE — Discharge Summary (Signed)
?Physician Discharge Summary ?  ?Patient: Caleb Nelson MRN: [id number] DOB: [birth year]  ?Admit date:     08/22/2021  ?Discharge date: 08/24/21  ?Discharge Physician: Enedina Finner  ? ?PCP: Rayetta Humphrey, MD  ? ?Recommendations at discharge:  ? ?keep your left leg elevated as much as possible. ?Follow-up with PCP in 7 to 10 days ?keep log of your blood pressure at home ? ?Discharge Diagnoses: ?left lower extremity cellulitis ? ?Hospital Course: ?[name] is a 51 year old male with PMH HTN, DM II who presented with left lower extremity redness, swelling, pain, and a blister.  He had been on outpatient doxycycline for approximately 3 days and stated that his symptoms were worsening with appearance of the blister. ? ?Cellulitis left lower extremity ?-- patient was started on IV antibiotics broad-spectrum. Blood culture remains negative. White count normal. Redness more localized to left lower ankle. Blister appears scabbed. No soft tissue collection. ?-- Lower extremity duplex negative for DVT ?-- change to PO Keflex  ?-- patient advised keep left leg elevated as much possible ? ?type II diabetes, no complication, well-controlled ?-- continue diet restriction ? ?Hypertension-- new ?-- blood pressure uncontrolled on admission 174/109 ?-- tolerating 20 mg of lisinopril== prescription given ? ?overall hemodynamically stable. Discharge to home. Patient agreeable. ?Recommended patient can take a few days off to keep left leg elevated. 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 ? ? ?  ? ? ?Consultants: none ?Procedures performed: none  ?Disposition: Home ?Diet recommendation:  ?Discharge Diet Orders (From admission, onward)  ? ?  Start     Ordered  ? 08/24/21 0000  Diet - low sodium heart healthy       ? 08/24/21 0830  ? ?  ?  ? ?  ? ?Cardiac and Carb modified diet ?DISCHARGE MEDICATION: ?Allergies as of 08/24/2021   ? ?   Reactions  ? Pioglitazone Other (See Comments)  ? Alopecia ?Alopecia  ? ?  ? ?   ?Medication List  ?  ? ?STOP taking these medications   ? ?dicyclomine 20 MG tablet ?Commonly known as: Bentyl ?  ?doxycycline 100 MG capsule ?Commonly known as: VIBRAMYCIN ?  ?fluticasone 50 MCG/ACT nasal spray ?Commonly known as: FLONASE ?  ?Multi-Vitamins Tabs ?  ?ondansetron 8 MG disintegrating tablet ?Commonly known as: Zofran ODT ?  ?pantoprazole 40 MG tablet ?Commonly known as: PROTONIX ?  ? ?  ? ?TAKE these medications   ? ?cephALEXin 500 MG capsule ?Commonly known as: KEFLEX ?Take 1 capsule (500 mg total) by mouth every 6 (six) hours for 7 days. ?  ?lisinopril 20 MG tablet ?Commonly known as: ZESTRIL ?Take 1 tablet (20 mg total) by mouth daily. ?  ? ?  ? ? Follow-up Information   ? ? Rayetta Humphrey, MD. Schedule an appointment as soon as possible for a visit in 1 week(s).   ?Specialty: Family Medicine ?Why: LE cellulitis ?Contact information: ?[address] ?[place] [address] ?[phone] ? ? ?  ?  ? ?  ?  ? ?  ? ?Discharge Exam: ?Ceasar Mons Weights  ? 08/22/21 1239  ?Weight: 113.4 kg  ? ? ?On 08/24/2021--dried skin scab... no fluid collection ? ?Condition at discharge: fair ? ?The results of significant diagnostics from this hospitalization (including imaging, microbiology, ancillary and laboratory) are listed below for reference.  ? ?Imaging Studies: ?DG Tibia/Fibula Left ? ?Result Date: 08/22/2021 ?CLINICAL DATA:  History of cellulitis. Left ankle and calf redness,  warmth, and swelling. EXAM: LEFT TIBIA AND FIBULA - 2 VIEW COMPARISON:  None. FINDINGS: There is no evidence of fracture, cortical erosion or other focal bone abnormality. Small posterior calcaneal enthesophytes. Subcutaneous edema is seen throughout the left lower leg and partially visualized dorsal left foot. No subcutaneous emphysema. IMPRESSION: No acute osseous abnormality. Diffuse subcutaneous edema in the lower left leg, consistent with reported clinical history of cellulitis. No subcutaneous emphysema. Electronically Signed   By:  Sherron AlesLaura  Parra M.D.   On: 08/22/2021 13:39  ? ?US Venous Img Lower Unilateral Left (DVT) ? ?Result Date: 08/23/2021 ?CLINICAL DATA:  Left lower extremity cellulitis, pain and edema EXAM: LEFT LOWER EXTREMITY VENOUS DOPPLER ULTRASOUND TECHNIQUE: Gray-scale sonography with graded compression, as well as color Doppler and duplex ultrasound were performed to evaluate the lower extremity deep venous systems from the level of the common femoral vein and including the common femoral, femoral, profunda femoral, popliteal and calf veins including the posterior tibial, peroneal and gastrocnemius veins when visible. The superficial great saphenous vein was also interrogated. Spectral Doppler was utilized to evaluate flow at rest and with distal augmentation maneuvers in the common femoral, femoral and popliteal veins. COMPARISON:  None. FINDINGS: Contralateral Common Femoral Vein: Respiratory phasicity is normal and symmetric with the symptomatic side. No evidence of thrombus. Normal compressibility. Common Femoral Vein: No evidence of thrombus. Normal compressibility, respiratory phasicity and response to augmentation. Saphenofemoral Junction: No evidence of thrombus. Normal compressibility and flow on color Doppler imaging. Profunda Femoral Vein: No evidence of thrombus. Normal compressibility and flow on color Doppler imaging. Femoral Vein: No evidence of thrombus. Normal compressibility, respiratory phasicity and response to augmentation. Popliteal Vein: No evidence of thrombus. Normal compressibility, respiratory phasicity and response to augmentation. Calf Veins: No evidence of thrombus. Normal compressibility and flow on color Doppler imaging. Other Findings: Left popliteal fossa nondistended Baker's cyst measures 2.9 x 0.9 x 1.6 cm. Mildly enlarged fatty replaced left inguinal lymph node measures 2.5 x 0.8 x 2.3 cm. IMPRESSION: Negative for acute left lower extremity DVT. 2.9 cm left Baker's cyst. Mild left inguinal  adenopathy with preserved architecture favored to be chronic reactive/inflammatory. Electronically Signed   By: Judie PetitM.  Shick M.D.   On: 08/23/2021 10:58   ? ?Microbiology: ?Results for orders placed or performed during the hospital encounter of 08/22/21  ?Culture, blood (routine x 2)     Status: None (Preliminary result)  ? Collection Time: 08/22/21  2:05 PM  ? Specimen: BLOOD  ?Result Value Ref Range Status  ? Specimen Description BLOOD RIGHT ANTECUBITAL  Final  ? Special Requests   Final  ?  BOTTLES DRAWN AEROBIC AND ANAEROBIC Blood Culture results may not be optimal due to an excessive volume of blood received in culture bottles  ? Culture   Final  ?  NO GROWTH 2 DAYS ?Performed at [facility], [address] [place], [place] [address] ?  ? Report Status PENDING  Incomplete  ?Culture, blood (routine x 2)     Status: None (Preliminary result)  ? Collection Time: 08/22/21  2:10 PM  ? Specimen: BLOOD  ?Result Value Ref Range Status  ? Specimen Description BLOOD LEFT ANTECUBITAL  Final  ? Special Requests   Final  ?  BOTTLES DRAWN AEROBIC AND ANAEROBIC Blood Culture results may not be optimal due to an excessive volume of blood received in culture bottles  ? Culture   Final  ?  NO GROWTH 2 DAYS ?Performed at [facility], [address] [place], [place] [address] ?  ? Report Status PENDING  Incomplete  ?MRSA Next Gen by PCR, Nasal     Status: None  ? Collection Time: 08/23/21  6:49 AM  ? Specimen: Nasal Mucosa; Nasal Swab  ?Result Value Ref Range Status  ? MRSA by PCR Next Gen NOT DETECTED NOT DETECTED Final  ?  Comment: (NOTE) ?The GeneXpert MRSA Assay (FDA approved for NASAL specimens only), ?is one component of a comprehensive MRSA colonization surveillance ?program. It is not intended to diagnose MRSA infection nor to guide ?or monitor treatment for MRSA infections. ?Test performance is not FDA approved in patients less than 2 years ?old. ?Performed at [facility], [address] [place], ?[place] [address] ?  ? ? ?Labs: ?CBC: ?Recent Labs  ?Lab 08/22/21 ?1240 08/22/21 ?1610 08/23/21 ?7408 08/24/21 ?0420  ?WBC 10.4 7.7 6.7 5.8  ?NEUTROABS 8.1*  --   --  3.8  ?HGB 14.8 13.5 12.7* 12.7*  ?HCT 4

## 2021-08-25 LAB — HEMOGLOBIN A1C
Hgb A1c MFr Bld: 5.5 % (ref 4.8–5.6)
Mean Plasma Glucose: 111 mg/dL

## 2021-08-27 LAB — CULTURE, BLOOD (ROUTINE X 2)
Culture: NO GROWTH
Culture: NO GROWTH

## 2022-02-27 ENCOUNTER — Ambulatory Visit
Admission: EM | Admit: 2022-02-27 | Discharge: 2022-02-27 | Disposition: A | Discharge status: Home / Self Care | Payer: BC Managed Care – PPO | Attending: Emergency Medicine | Admitting: Emergency Medicine

## 2022-02-27 DIAGNOSIS — L03116 Cellulitis of left lower limb: Secondary | ICD-10-CM | POA: Diagnosis not present

## 2022-02-27 DIAGNOSIS — B360 Pityriasis versicolor: Secondary | ICD-10-CM

## 2022-02-27 MED ORDER — DOXYCYCLINE HYCLATE 100 MG PO CAPS: 100.0000 mg | ORAL_CAPSULE | Freq: Two times a day (BID) | ORAL | 0 refills | Status: DC

## 2022-02-27 MED ORDER — TERBINAFINE HCL 250 MG PO TABS: 250.0000 mg | ORAL_TABLET | Freq: Every day | ORAL | 0 refills | Status: DC

## 2022-02-27 NOTE — ED Triage Notes (Addendum)
Pt here for cut to lower left leg Friday, pt states he cut it on a drawer, swelling. Pt states he is just concerned because he did have to be in the hospital receiving IV abx for something similar a few months ago.  Pt also here for rash x several months on back, itchy.

## 2022-02-27 NOTE — ED Provider Notes (Signed)
[facility]    CSN: [id number] Arrival date & time: 02/27/22  1554      History   Chief Complaint Chief Complaint  Patient presents with   Laceration    LT lower leg   Rash    HPI Caleb Nelson is a 51 y.o. male.   HPI  51 year old male here for evaluation of skin complaints.  Patient's first complaint is that he has redness and swelling around a superficial laceration he sustained to the inner aspect of his left ankle 2 days ago.  He noticed the redness and swelling yesterday so he came in to have it evaluated.  He had something similar several months ago that wound up placing him in the hospital and IV antibiotics for several days and he does not with that to happen again.  He is a diabetic.  He does not check his sugar so is not sure how high its been running.  He denies any drainage.  Patient second complaint is that he has had an itchy rash on his low back and right hip for several months.  Past Medical History:  Diagnosis Date   Diabetes mellitus without complication (HCC)    Hypertension     Patient Active Problem List   Diagnosis Date Noted   Normocytic anemia 08/23/2021   Cellulitis of right lower extremity 08/22/2021   Dyslipidemia 08/30/2016   Type 2 diabetes mellitus (HCC) 08/30/2016   Benign essential hypertension 10/16/2014   Fatty liver disease, nonalcoholic 08/13/2013    Past Surgical History:  Procedure Laterality Date   CHOLECYSTECTOMY     GASTRIC RESTRICTION SURGERY         Home Medications    Prior to Admission medications   Medication Sig Start Date End Date Taking? Authorizing Provider  doxycycline (VIBRAMYCIN) 100 MG capsule Take 1 capsule (100 mg total) by mouth 2 (two) times daily. 02/27/22  Yes Becky Augusta, NP  terbinafine (LAMISIL) 250 MG tablet Take 1 tablet (250 mg total) by mouth daily. 02/27/22  Yes Becky Augusta, NP  lisinopril (ZESTRIL) 20 MG tablet Take 1 tablet (20 mg total) by mouth daily. 08/24/21   Enedina Finner, MD     Family History History reviewed. No pertinent family history.  Social History Social History   Tobacco Use   Smoking status: Never   Smokeless tobacco: Never  Vaping Use   Vaping Use: Some days  Substance Use Topics   Alcohol use: Never   Drug use: Never     Allergies   Pioglitazone   Review of Systems Review of Systems  Constitutional:  Negative for fever.  Skin:  Positive for color change, rash and wound.     Physical Exam Triage Vital Signs ED Triage Vitals  Enc Vitals Group     BP 02/27/22 1601 (!) 181/93     Pulse Rate 02/27/22 1601 88     Resp --      Temp 02/27/22 1601 98.6 F (37 C)     Temp Source 02/27/22 1601 Oral     SpO2 02/27/22 1601 98 %     Weight 02/27/22 1600 250 lb (113.4 kg)     Height 02/27/22 1600 6' (1.829 m)     Head Circumference --      Peak Flow --      Pain Score 02/27/22 1600 0     Pain Loc --      Pain Edu? --      Excl. in GC? --  No data found.  Updated Vital Signs BP (!) 181/93 (BP Location: Right Arm)   Pulse 88   Temp 98.6 F (37 C) (Oral)   Ht 6' (1.829 m)   Wt 250 lb (113.4 kg)   SpO2 98%   BMI 33.91 kg/m   Visual Acuity Right Eye Distance:   Left Eye Distance:   Bilateral Distance:    Right Eye Near:   Left Eye Near:    Bilateral Near:     Physical Exam Vitals and nursing note reviewed.  Musculoskeletal:        General: Swelling and signs of injury present. No tenderness.  Skin:    General: Skin is warm and dry.     Capillary Refill: Capillary refill takes less than 2 seconds.     Findings: Erythema and rash present.  Neurological:     General: No focal deficit present.     Mental Status: He is oriented to person, place, and time.  Psychiatric:        Mood and Affect: Mood normal.        Behavior: Behavior normal.        Thought Content: Thought content normal.        Judgment: Judgment normal.      UC Treatments / Results  Labs (all labs ordered are listed, but only abnormal  results are displayed) Labs Reviewed - No data to display  EKG   Radiology No results found.  Procedures Procedures (including critical care time)  Medications Ordered in UC Medications - No data to display  Initial Impression / Assessment and Plan / UC Course  I have reviewed the triage vital signs and the nursing notes.  Pertinent labs & imaging results that were available during my care of the patient were reviewed by me and considered in my medical decision making (see chart for details).   Patient is a pleasant, nontoxic-appearing 51 year old male here for evaluation of redness and swelling surrounding a superficial laceration on the inner aspect of his left ankle that he first noticed yesterday.  He sustained the laceration 2 days ago.  The surrounding tissue is erythematous and edematous but it is not hot or tender.  The redness is blanchable.  The wound is clean and dry.  I will place patient on doxycycline twice daily for 10 days for treatment of developing cellulitis.  Patient is also complaining of a rash on his low back and left hip that have been present for the last several months.    The rash is erythema mixed throughout the discoloration and it is itchy.  No flaky skin over top.  The rash is not hot to touch.  I will treat the patient for tinea versicolor with terbinafine 250 mg once daily for 2 weeks.   Final Clinical Impressions(s) / UC Diagnoses   Final diagnoses:  Cellulitis of left lower extremity  Tinea versicolor     Discharge Instructions      Take the Doxycycline twice daily with food for 10 days.  Doxycycline will make you more sensitive to sunburn so wear sunscreen when outdoors and reapply it every 90 minutes.  Apply warm compresses to help promote drainage.  Use OTC Tylenol and Ibuprofen according to the package instructions as needed for pain.  Return for new or worsening symptoms.    Take the Lamisil once daily for 2 weeks for  treatment of the fungal infection in your low back and on your right hip.  ED Prescriptions     Medication Sig Dispense Auth. Provider   doxycycline (VIBRAMYCIN) 100 MG capsule Take 1 capsule (100 mg total) by mouth 2 (two) times daily. 20 capsule Margarette Canada, NP   terbinafine (LAMISIL) 250 MG tablet Take 1 tablet (250 mg total) by mouth daily. 14 tablet Margarette Canada, NP      PDMP not reviewed this encounter.   Margarette Canada, NP 02/27/22 [phone]

## 2022-02-27 NOTE — Discharge Instructions (Signed)
Take the Doxycycline twice daily with food for 10 days.  Doxycycline will make you more sensitive to sunburn so wear sunscreen when outdoors and reapply it every 90 minutes.  Apply warm compresses to help promote drainage.  Use OTC Tylenol and Ibuprofen according to the package instructions as needed for pain.  Return for new or worsening symptoms.    Take the Lamisil once daily for 2 weeks for treatment of the fungal infection in your low back and on your right hip.

## 2023-06-11 LAB — BASIC METABOLIC PANEL WITH GFR: Creatinine: 0.9 (ref 0.6–1.3)

## 2023-06-11 LAB — COMPREHENSIVE METABOLIC PANEL WITH GFR: eGFR: >90

## 2023-06-12 ENCOUNTER — Ambulatory Visit: Payer: BC Managed Care – PPO

## 2023-06-26 ENCOUNTER — Telehealth: Payer: Self-pay

## 2023-06-26 NOTE — Telephone Encounter (Signed)
Copied from CRM [id number]. Topic: Appointment Scheduling - Scheduling Inquiry for Clinic >> Jun 26, 2023 10:49 AM Franchot Heidelberg wrote: Reason for CRM: Pt wants to reschedule, has used profane and abusive language. Please advise  Best contact: [phone]  Says the phone number he was provided goes to a busy signal.   Says he wants his original appt, please leave a VM he says that he is working too.

## 2023-06-28 ENCOUNTER — Ambulatory Visit: Payer: Managed Care, Other (non HMO) | Admitting: Physician Assistant

## 2023-07-05 ENCOUNTER — Encounter: Payer: Self-pay | Admitting: Physician Assistant

## 2023-07-05 ENCOUNTER — Ambulatory Visit: Payer: Managed Care, Other (non HMO) | Admitting: Physician Assistant

## 2023-07-05 VITALS — BP 132/72 | HR 80 | Temp 97.9°F | Ht 72.0 in | Wt 232.0 lb

## 2023-07-05 DIAGNOSIS — E119 Type 2 diabetes mellitus without complications: Secondary | ICD-10-CM | POA: Diagnosis not present

## 2023-07-05 DIAGNOSIS — Z789 Other specified health status: Secondary | ICD-10-CM | POA: Insufficient documentation

## 2023-07-05 DIAGNOSIS — Z720 Tobacco use: Secondary | ICD-10-CM | POA: Diagnosis not present

## 2023-07-05 DIAGNOSIS — I10 Essential (primary) hypertension: Secondary | ICD-10-CM

## 2023-07-05 LAB — POCT GLYCOSYLATED HEMOGLOBIN (HGB A1C): Hemoglobin A1C: 6.1 % — AB (ref 4.0–5.6)

## 2023-07-05 NOTE — Progress Notes (Signed)
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  He tells me he has been out of all of his medications for 3 months.  He has a fast-paced work environment at [employer], typically works 12-hour shifts 5 days a week.  He drinks soda and sweet tea constantly, estimates filling his soda cup 10-15 times each shift.  States he would rather drink than eat most of the time. Also reports using a nicotine vape about every 5 minutes.   Medication list has been reviewed and updated.  No outpatient medications have been marked as taking for the 07/05/23 encounter (Office Visit) with Remo Lipps, PA.     Review of Systems  Patient Active Problem List   Diagnosis Date Noted   Vapes nicotine containing substance 07/05/2023   Excessive caffeine intake 07/05/2023   Normocytic anemia 08/23/2021   Recurrent cellulitis of lower extremity 08/22/2021   Dyslipidemia 08/30/2016   Type 2 diabetes mellitus in remission (HCC) 08/30/2016   Benign essential hypertension 10/16/2014   Fatty liver disease, nonalcoholic 08/13/2013    Allergies  Allergen Reactions   Pioglitazone Other (See Comments)    Alopecia Alopecia     Immunization History  Administered Date(s) Administered   Hep A / Hep B 12/15/2015, 02/02/2016, 06/21/2016   Influenza, Seasonal, Injecte, Preservative Fre 01/23/2017   Influenza,inj,Quad PF,6+ Mos 01/28/2014, 02/02/2016   Moderna Sars-Covid-2 Vaccination 12/26/2019   Pneumococcal Polysaccharide-23 03/07/2011   Tdap 12/30/2013    Past Surgical History:  Procedure Laterality Date   CHOLECYSTECTOMY     GASTRIC RESTRICTION SURGERY      Social History   Tobacco Use   Smoking status: Never   Smokeless  tobacco: Never  Vaping Use   Vaping status: Every Day   Substances: Nicotine  Substance Use Topics   Alcohol use: Yes    Comment: occassionally   Drug use: Never    History reviewed. No pertinent family history.      07/05/2023   10:44 AM  GAD 7 : Generalized Anxiety Score  Nervous, Anxious, on Edge 1  Control/stop worrying 0  Worry too much - different things 1  Trouble relaxing 0  Restless 0  Easily annoyed or irritable 1  Afraid - awful might happen 0  Total GAD 7 Score 3  Anxiety Difficulty Not difficult at all       07/05/2023   10:44 AM  Depression screen PHQ 2/9  Decreased Interest 0  Down, Depressed, Hopeless 0  PHQ - 2 Score 0    BP Readings from Last 3 Encounters:  07/05/23 132/72  02/27/22 (!) 181/93  08/24/21 126/86    Wt Readings from Last 3 Encounters:  07/05/23 232 lb (105.2 kg)  02/27/22 250 lb (113.4 kg)  08/22/21 250 lb (113.4 kg)    BP 132/72 (BP Location: Left Arm, Patient Position: Sitting, Cuff Size: Large)   Pulse 80   Temp 97.9 F (36.6 C)   Ht 6' (1.829 m)   Wt 232 lb (105.2 kg)   SpO2 99%   BMI 31.46 kg/m   Physical Exam Vitals and nursing note reviewed.  Constitutional:      Appearance: Normal appearance.  Cardiovascular:  Rate and Rhythm: Normal rate and regular rhythm.     Heart sounds: No murmur heard.    No friction rub. No gallop.  Pulmonary:     Effort: Pulmonary effort is normal.     Breath sounds: Normal breath sounds.  Abdominal:     General: There is no distension.  Musculoskeletal:        General: Normal range of motion.  Skin:    General: Skin is warm and dry.     Comments: No active cellulitis of LE  Neurological:     Mental Status: He is alert and oriented to person, place, and time.     Gait: Gait is intact.  Psychiatric:        Mood and Affect: Mood and affect normal.     Recent Labs     Component Value Date/Time   NA 140 08/24/2021 0420   K 3.6 08/24/2021 0420   CL 108 08/24/2021 0420    CO2 25 08/24/2021 0420   GLUCOSE 103 (H) 08/24/2021 0420   BUN 14 08/24/2021 0420   CREATININE 0.76 08/24/2021 0420   CALCIUM 8.3 (L) 08/24/2021 0420   PROT 8.1 08/22/2021 1240   ALBUMIN 4.1 08/22/2021 1240   AST 21 08/22/2021 1240   ALT 22 08/22/2021 1240   ALKPHOS 88 08/22/2021 1240   BILITOT 1.3 (H) 08/22/2021 1240   GFRNONAA >60 08/24/2021 0420   GFRAA >60 02/19/2019 1953    Lab Results  Component Value Date   WBC 5.8 08/24/2021   HGB 12.7 (L) 08/24/2021   HCT 38.5 (L) 08/24/2021   MCV 83.3 08/24/2021   PLT 230 08/24/2021   Lab Results  Component Value Date   HGBA1C 6.1 (A) 07/05/2023   Lab Results  Component Value Date   CHOL 78 08/23/2021   HDL 27 (L) 08/23/2021   LDLCALC 39 08/23/2021   TRIG 58 08/23/2021   CHOLHDL 2.9 08/23/2021   No results found for: "TSH"   Assessment and Plan:  1. Type 2 diabetes mellitus in remission (HCC) (Primary) A1c 6.1% today reflecting fair glycemic control without medication, which is surprising given his intake of sugar sweetened beverages.  For now, we will stay off medication and recheck A1c in 3 months.  Advised to significantly reduce intake of sugar sweetened beverages. - POCT HgB A1C  2. Benign essential hypertension Borderline hypertensive readings x 2 today in the absence of medication.  Discussed with patient if he reduce caffeine and/or nicotine intake, his blood pressure would also likely improve.  He does not check blood pressure at home and does not have interest in doing so.  No pharmacotherapy for now.  3. Vapes nicotine containing substance Encouraged to reduce  4. Excessive caffeine intake Encouraged to reduce   Return in about 3 months (around 10/02/2023) for CPE.  Plan to recheck A1c, lipids, LFTs, PSA, micral.   Alvester Morin, PA-C, DMSc, Nutritionist [facility] [phone]

## 2023-07-05 NOTE — Patient Instructions (Addendum)
-  It was a pleasure to see you today! Please review your visit summary for helpful information  -I would encourage you to follow your care via MyChart where you can access lab results, notes, messages, and more -If you feel that we did a nice job today, please complete your after-visit survey and leave [place] a Google review! Your CMA today was Mariann Barter and your provider was Alvester Morin, PA-C, DMSc -Please return for follow-up in about 3 months for a physical with lab work

## 2023-07-26 ENCOUNTER — Ambulatory Visit: Payer: Self-pay | Admitting: Physician Assistant

## 2023-09-29 IMAGING — US US EXTREM LOW VENOUS*L*
1 series · 13 of 24 positions shown · non-contrast
Comparison: None.

CLINICAL DATA: Left lower extremity cellulitis, pain and edema



[Series 1: us venous img lower uni left (dvt) · portal-venous · 13 of 48 slices shown]
[im 1/48]
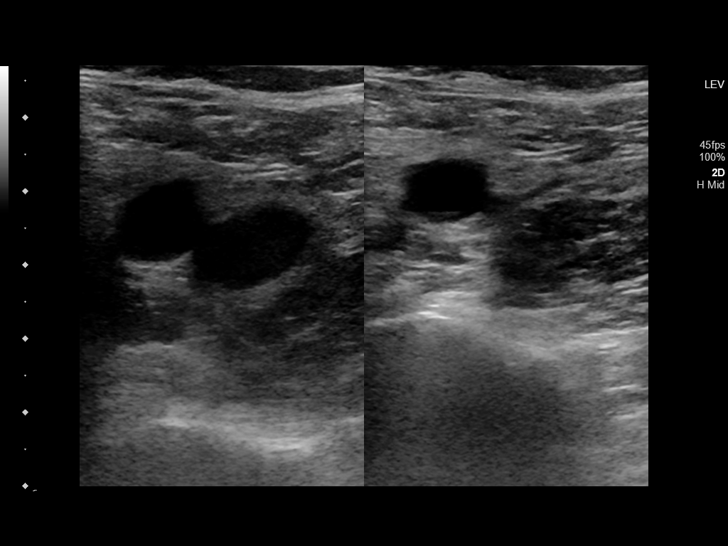
[im 5/48]
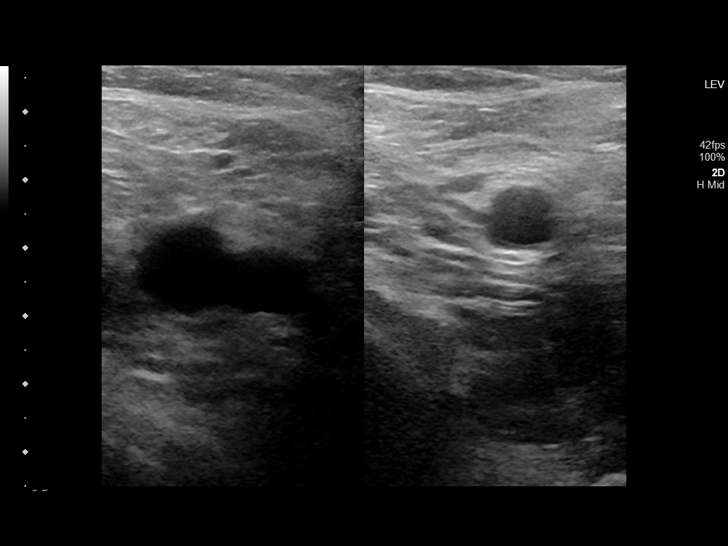
[im 9/48]
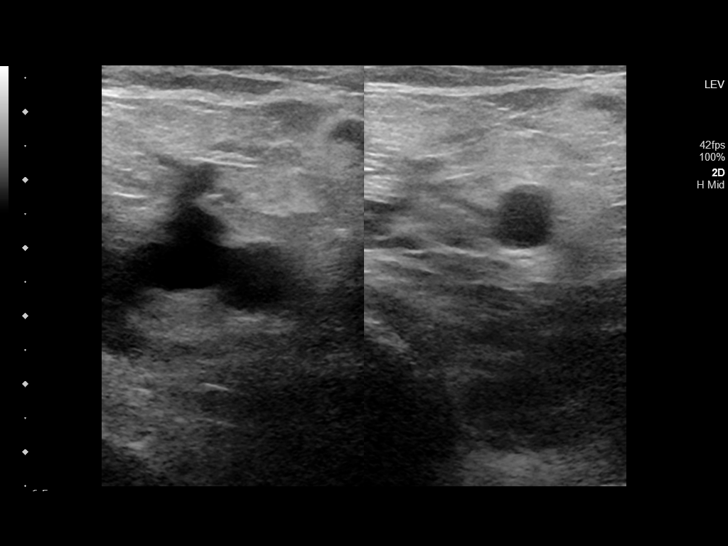
[im 13/48]
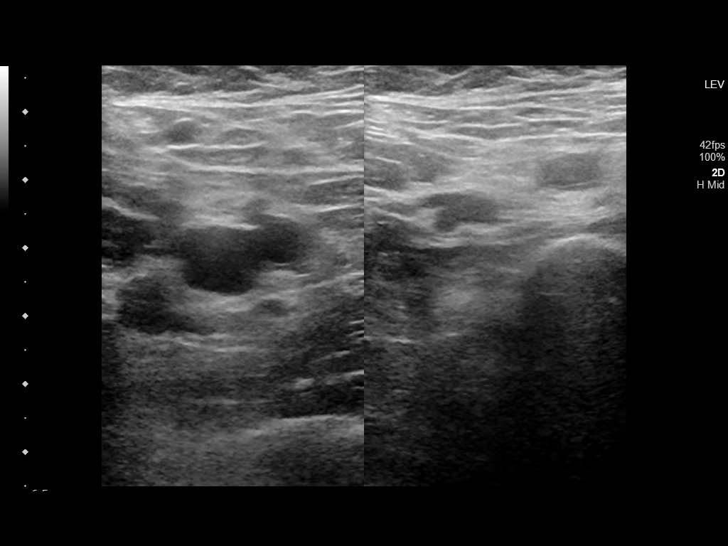
[im 17/48]
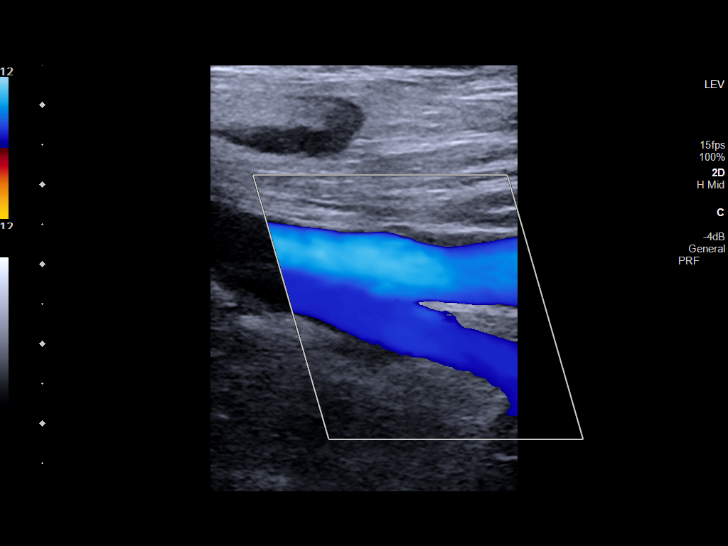
[im 21/48]
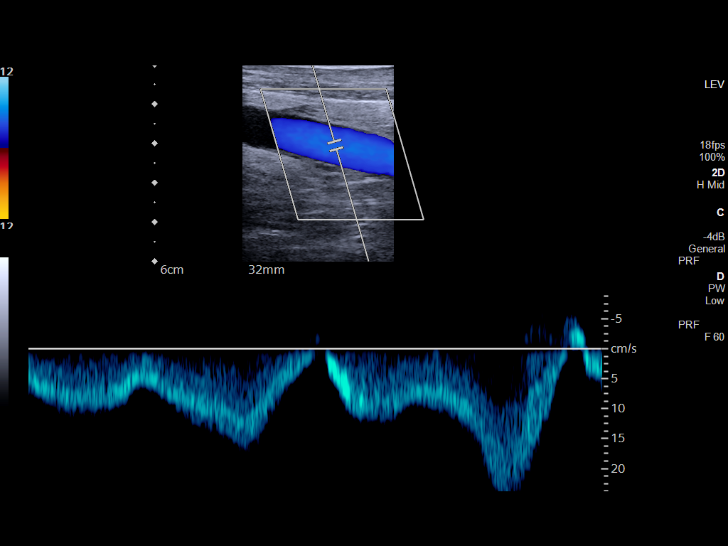
[im 25/48]
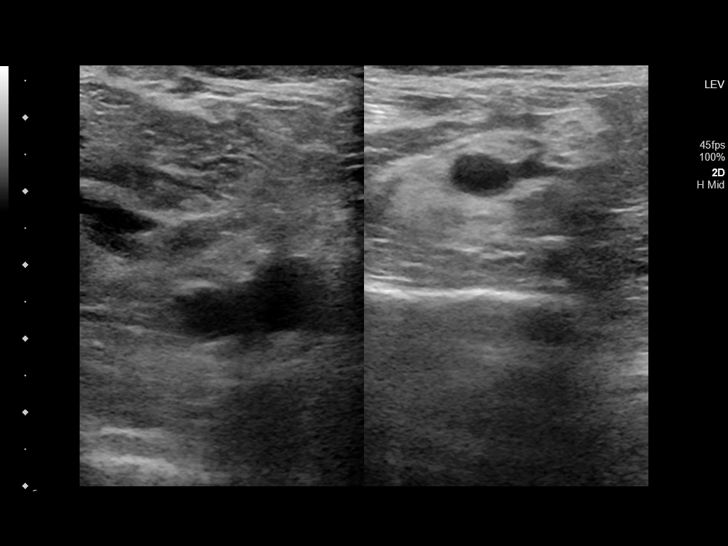
[im 27/48]
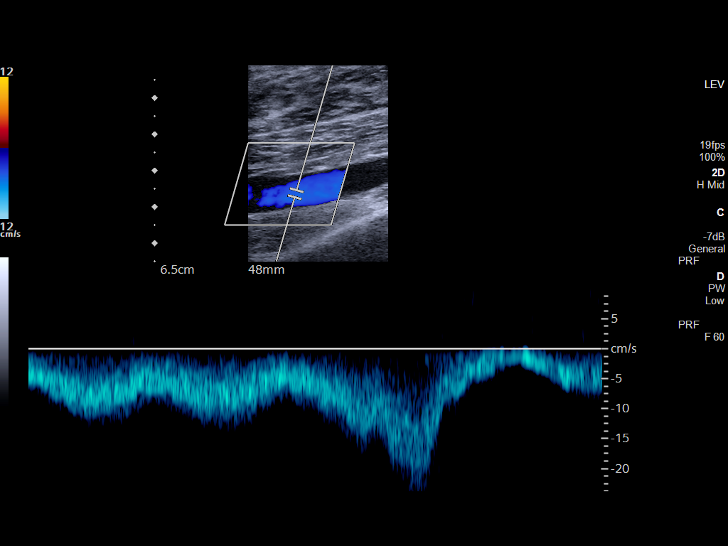
[im 31/48]
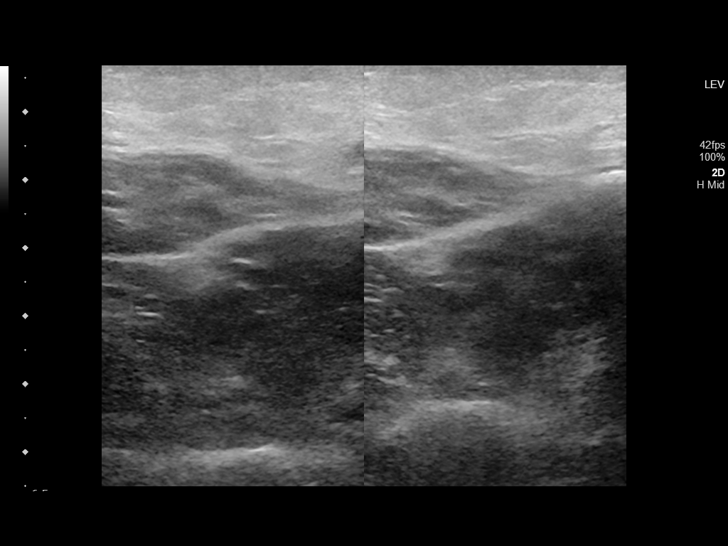
[im 35/48]
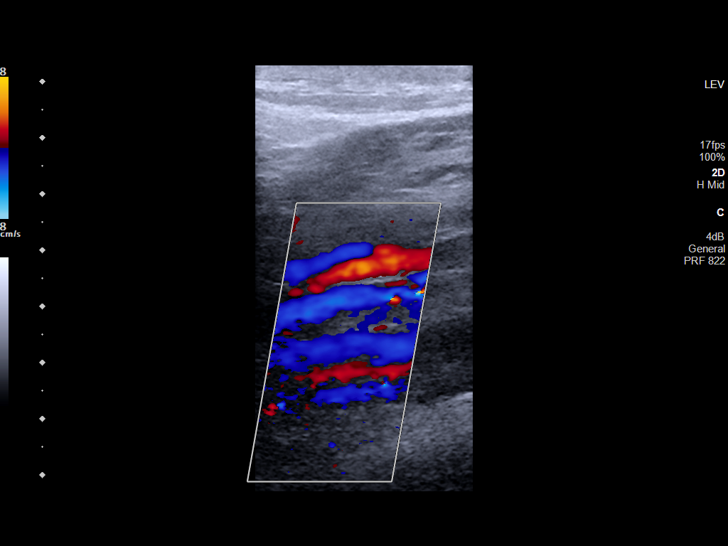
[im 39/48]
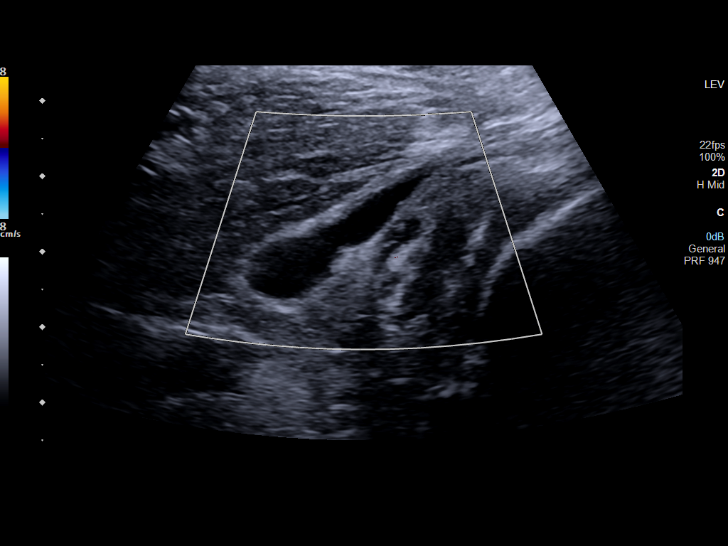
[im 43/48]
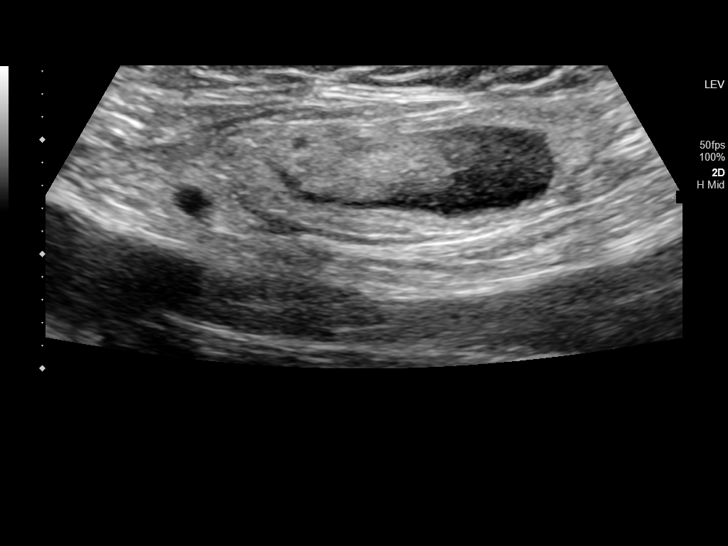
[im 48/48]
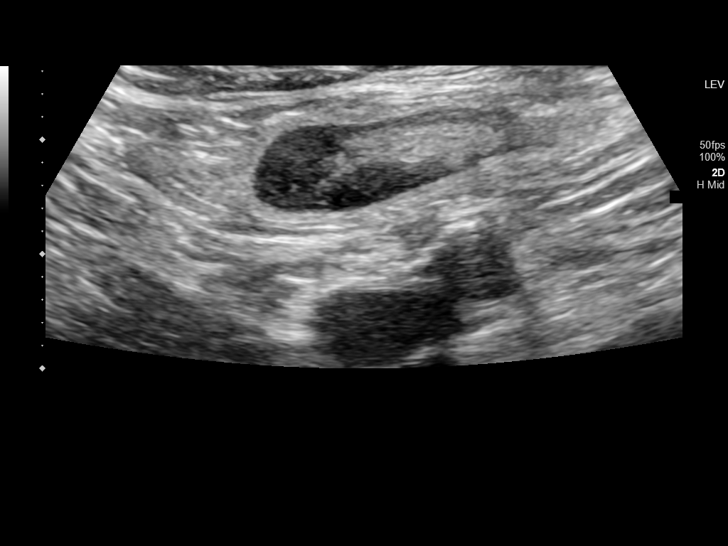

[13 of 24 positions shown; findings below may reference images not displayed]

FINDINGS: Contralateral Common Femoral Vein: Respiratory phasicity is normal
and symmetric with the symptomatic side. No evidence of thrombus.
Normal compressibility.

Common Femoral Vein: No evidence of thrombus. Normal
compressibility, respiratory phasicity and response to augmentation.

Saphenofemoral Junction: No evidence of thrombus. Normal
compressibility and flow on color Doppler imaging.

Profunda Femoral Vein: No evidence of thrombus. Normal
compressibility and flow on color Doppler imaging.

Femoral Vein: No evidence of thrombus. Normal compressibility,
respiratory phasicity and response to augmentation.

Popliteal Vein: No evidence of thrombus. Normal compressibility,
respiratory phasicity and response to augmentation.

Calf Veins: No evidence of thrombus. Normal compressibility and flow
on color Doppler imaging.

Other Findings: Left popliteal fossa nondistended Baker's cyst
measures 2.9 x 0.9 x 1.6 cm. Mildly enlarged fatty replaced left
inguinal lymph node measures 2.5 x 0.8 x 2.3 cm.
IMPRESSION: Negative for acute left lower extremity DVT.

2.9 cm left Baker's cyst.

Mild left inguinal adenopathy with preserved architecture favored to
be chronic reactive/inflammatory.

## 2023-10-03 ENCOUNTER — Encounter: Payer: Self-pay | Admitting: Physician Assistant

## 2023-10-03 ENCOUNTER — Ambulatory Visit (INDEPENDENT_AMBULATORY_CARE_PROVIDER_SITE_OTHER): Payer: Managed Care, Other (non HMO) | Admitting: Physician Assistant

## 2023-10-03 VITALS — BP 136/80 | HR 95 | Temp 98.3°F | Ht 72.0 in | Wt 231.0 lb

## 2023-10-03 DIAGNOSIS — I10 Essential (primary) hypertension: Secondary | ICD-10-CM | POA: Diagnosis not present

## 2023-10-03 DIAGNOSIS — Z9884 Bariatric surgery status: Secondary | ICD-10-CM

## 2023-10-03 DIAGNOSIS — Z0000 Encounter for general adult medical examination without abnormal findings: Secondary | ICD-10-CM | POA: Diagnosis not present

## 2023-10-03 DIAGNOSIS — E785 Hyperlipidemia, unspecified: Secondary | ICD-10-CM | POA: Diagnosis not present

## 2023-10-03 DIAGNOSIS — Z1211 Encounter for screening for malignant neoplasm of colon: Secondary | ICD-10-CM

## 2023-10-03 DIAGNOSIS — K760 Fatty (change of) liver, not elsewhere classified: Secondary | ICD-10-CM

## 2023-10-03 DIAGNOSIS — Z2821 Immunization not carried out because of patient refusal: Secondary | ICD-10-CM | POA: Insufficient documentation

## 2023-10-03 DIAGNOSIS — K2970 Gastritis, unspecified, without bleeding: Secondary | ICD-10-CM | POA: Insufficient documentation

## 2023-10-03 DIAGNOSIS — Z125 Encounter for screening for malignant neoplasm of prostate: Secondary | ICD-10-CM

## 2023-10-03 DIAGNOSIS — R809 Proteinuria, unspecified: Secondary | ICD-10-CM

## 2023-10-03 DIAGNOSIS — E1129 Type 2 diabetes mellitus with other diabetic kidney complication: Secondary | ICD-10-CM | POA: Diagnosis not present

## 2023-10-03 LAB — POCT GLYCOSYLATED HEMOGLOBIN (HGB A1C): Hemoglobin A1C: 6.1 % — AB (ref 4.0–5.6)

## 2023-10-03 NOTE — Progress Notes (Signed)
 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  Not currently using any prescription medications as his diabetes seems to be under control.  He has a fast-paced work environment at [employer], typically works 12-hour shifts 5 days a week.  He drinks soda and sweet tea constantly, estimates filling his soda cup 10-15 times each shift.  States he would rather drink than eat most of the time. Also reports using a nicotine  vape about every 5 minutes.  Diarrhea 2 weeks, same frequency as usual but just loose stools. Stressful work environment.   Pruritic rash on forearms/hands, thought they were burns, but have not recovered after two weeks. Neosporin did not help.   Last Physical: unknown Last Dental Exam: 2 mos ago Last Eye Exam: 5y ago  Last CRC screen: Never Last PSA: Mar 2024 normal Immunizations Due: Prevnar 20, Shingrix, Hep B. Declines.   Medication list has been reviewed and updated.  No outpatient medications have been marked as taking for the 10/03/23 encounter (Office Visit) with Leopoldo Rancher, PA.     Review of Systems  Patient Active Problem List   Diagnosis Date Noted   Immunization declined 10/03/2023   Vapes nicotine  containing substance 07/05/2023   Excessive caffeine intake 07/05/2023   Dyslipidemia 08/30/2016   Controlled type 2 diabetes mellitus with microalbuminuria, without long-term current use of insulin  (HCC) 08/30/2016   Hypertension 08/30/2016   Chronic right shoulder pain 02/02/2016   Not immune to hepatitis B virus 03/13/2015   Carpal tunnel syndrome of right wrist 09/11/2014   OSA (obstructive sleep apnea) 08/19/2014   Hepatomegaly 01/01/2014   Plantar  fasciitis 08/13/2013   Fatty liver disease, nonalcoholic 08/13/2013   Class 1 obesity with serious comorbidity in adult 08/13/2013    Allergies  Allergen Reactions   Pioglitazone Other (See Comments)    Alopecia Alopecia     Immunization History  Administered Date(s) Administered   Hep A / Hep B 12/15/2015, 02/02/2016, 06/21/2016   Influenza, Seasonal, Injecte, Preservative Fre 01/23/2017   Influenza,inj,Quad PF,6+ Mos 01/28/2014, 02/02/2016   Moderna Sars-Covid-2 Vaccination 12/26/2019   Pneumococcal Polysaccharide-23 03/07/2011   Tdap 12/30/2013    Past Surgical History:  Procedure Laterality Date   CHOLECYSTECTOMY     GASTRIC RESTRICTION SURGERY      Social History   Tobacco Use   Smoking status: Never   Smokeless tobacco: Never  Vaping Use   Vaping status: Every Day   Substances: Nicotine   Substance Use Topics   Alcohol use: Yes    Comment: occassionally   Drug use: Never    History reviewed. No pertinent family history.      10/03/2023    8:34 AM 07/05/2023   10:44 AM  GAD 7 : Generalized Anxiety Score  Nervous, Anxious, on Edge 0 1  Control/stop worrying 1 0  Worry too much - different things 1 1  Trouble relaxing 3 0  Restless 0 0  Easily annoyed or irritable 1 1  Afraid - awful might happen 0 0  Total GAD 7 Score 6 3  Anxiety Difficulty Not difficult at all Not difficult at all  10/03/2023    8:34 AM 07/05/2023   10:44 AM  Depression screen PHQ 2/9  Decreased Interest 1 0  Down, Depressed, Hopeless 0 0  PHQ - 2 Score 1 0    BP Readings from Last 3 Encounters:  10/03/23 136/80  07/05/23 132/72  02/27/22 (!) 181/93    Wt Readings from Last 3 Encounters:  10/03/23 231 lb (104.8 kg)  07/05/23 232 lb (105.2 kg)  02/27/22 250 lb (113.4 kg)    BP 136/80 (BP Location: Left Arm, Cuff Size: Normal)   Pulse 95   Temp 98.3 F (36.8 C)   Ht 6' (1.829 m)   Wt 231 lb (104.8 kg)   SpO2 100%   BMI 31.33 kg/m   Physical Exam Vitals  and nursing note reviewed.  Constitutional:      Appearance: Normal appearance.  HENT:     Ears:     Comments: EAC clear bilaterally with good view of TM which is without effusion or erythema.     Nose: Nose normal.     Mouth/Throat:     Mouth: Mucous membranes are moist. No oral lesions.     Dentition: Normal dentition.     Pharynx: No posterior oropharyngeal erythema.     Comments: Staining appreciated Eyes:     Extraocular Movements: Extraocular movements intact.     Pupils: Pupils are equal, round, and reactive to light.     Comments: Moderate-severe injection of bilateral palpebral conjunctivae  Neck:     Thyroid: No thyromegaly.  Cardiovascular:     Rate and Rhythm: Normal rate and regular rhythm.     Heart sounds: No murmur heard.    No friction rub. No gallop.     Comments: Pulses 2+ at radial, PT, DP bilaterally. No carotid bruit. No peripheral edema Pulmonary:     Effort: Pulmonary effort is normal.     Breath sounds: Normal breath sounds.  Abdominal:     General: Abdomen is protuberant. Bowel sounds are increased.     Palpations: Abdomen is soft. There is hepatomegaly. There is no mass.     Tenderness: There is no abdominal tenderness.  Genitourinary:    Comments: Genital/rectal exam deferred.  Musculoskeletal:     Comments: Full ROM with strength 5/5 bilateral upper and lower extremities  Lymphadenopathy:     Cervical: No cervical adenopathy.  Skin:    General: Skin is warm.     Capillary Refill: Capillary refill takes less than 2 seconds.     Findings: Rash present. No lesion.     Comments: Scattered erythematous dried rash of bilateral arms and forearms. Hyperpigmentation near left ankle from distant cellulitis.   Neurological:     Mental Status: He is alert and oriented to person, place, and time.     Gait: Gait is intact.  Psychiatric:        Mood and Affect: Mood normal.        Behavior: Behavior normal.    Diabetic Foot Exam - Simple   Simple Foot  Form Diabetic Foot exam was performed with the following findings: Yes 10/03/2023  9:00 AM  Visual Inspection No deformities, no ulcerations, no other skin breakdown bilaterally: Yes Sensation Testing Intact to touch and monofilament testing bilaterally: Yes Pulse Check Posterior Tibialis and Dorsalis pulse intact bilaterally: Yes Comments      Recent Labs     Component Value Date/Time   NA 140 08/24/2021 0420   K 3.6 08/24/2021 0420   CL 108 08/24/2021  0420   CO2 25 08/24/2021 0420   GLUCOSE 103 (H) 08/24/2021 0420   BUN 14 08/24/2021 0420   CREATININE 0.9 06/11/2023 0000   CREATININE 0.76 08/24/2021 0420   CALCIUM 8.3 (L) 08/24/2021 0420   PROT 8.1 08/22/2021 1240   ALBUMIN 4.1 08/22/2021 1240   AST 21 08/22/2021 1240   ALT 22 08/22/2021 1240   ALKPHOS 88 08/22/2021 1240   BILITOT 1.3 (H) 08/22/2021 1240   GFRNONAA >60 08/24/2021 0420   GFRAA >60 02/19/2019 1953    Lab Results  Component Value Date   WBC 5.8 08/24/2021   HGB 12.7 (L) 08/24/2021   HCT 38.5 (L) 08/24/2021   MCV 83.3 08/24/2021   PLT 230 08/24/2021   Lab Results  Component Value Date   HGBA1C 6.1 (A) 10/03/2023   Lab Results  Component Value Date   CHOL 78 08/23/2021   HDL 27 (L) 08/23/2021   LDLCALC 39 08/23/2021   TRIG 58 08/23/2021   CHOLHDL 2.9 08/23/2021   No results found for: "TSH"  Lab Results  Component Value Date   HGBA1C 6.1 (A) 10/03/2023   HGBA1C 6.1 (A) 07/05/2023   HGBA1C 5.5 08/23/2021     Assessment and Plan:  1. Annual physical exam (Primary) Generally unhealthy lifestyle, does not tend to take medications or follow medical advice. Would benefit from regular physical activity and consumption of whole fruits and vegetables. Encouraged routine dental and eye exams.   - POCT HgB A1C - Lipid panel - Microalbumin / creatinine urine ratio - Hepatic function panel - PSA - Cologuard  2. Controlled type 2 diabetes mellitus with microalbuminuria, without long-term  current use of insulin  (HCC) A1c 6.1% today reflecting fair glycemic control without medication, stable from last time, which is surprising given his intake of sugar sweetened beverages. Advised to significantly reduce intake of sugar sweetened beverages.   - POCT HgB A1C - Lipid panel - Microalbumin / creatinine urine ratio  3. Primary hypertension Elevated x2 in clinic, same as last visit. Discussed with patient if he reduce caffeine and/or nicotine  intake, his blood pressure would also likely improve. He does not check blood pressure at home and does not have interest in doing so. Does not want pharmacotherapy for this problem.   4. Dyslipidemia Check nonfasting lipids - Lipid panel  5. Fatty liver disease, nonalcoholic - Lipid panel - Hepatic function panel  6. Immunization declined Patient was "sick" after flu shot once, now refuses all vaccines   7. Screening for colon cancer Patient willing to do Cologuard for initial CRC screen. Strongly opposes colonoscopy.  - Cologuard  8. Screening PSA (prostate specific antigen) Refused prostate exam. Check PSA.  - PSA    Regarding diarrhea, may be stress related. First step would be modification of diet and OTC antidiarrheal. For the rash, favor contact dermatitis vs psoriasis - try OTC hydrocortisone as initial therapy.   Return in about 6 months (around 04/04/2024) for OV f/u chronic conditions.    Cody Das, PA-C, DMSc, Nutritionist [facility] [phone]

## 2023-10-04 ENCOUNTER — Ambulatory Visit: Payer: Self-pay | Admitting: Physician Assistant

## 2023-10-04 DIAGNOSIS — Z9884 Bariatric surgery status: Secondary | ICD-10-CM | POA: Insufficient documentation

## 2023-10-04 LAB — HEPATIC FUNCTION PANEL
ALT: 60 IU/L — ABNORMAL HIGH (ref 0–44)
AST: 46 IU/L — ABNORMAL HIGH (ref 0–40)
Albumin: 4.2 g/dL (ref 3.8–4.9)
Alkaline Phosphatase: 160 IU/L — ABNORMAL HIGH (ref 44–121)
Bilirubin Total: 0.5 mg/dL (ref 0.0–1.2)
Bilirubin, Direct: 0.19 mg/dL (ref 0.00–0.40)
Total Protein: 6.7 g/dL (ref 6.0–8.5)

## 2023-10-04 LAB — MICROALBUMIN / CREATININE URINE RATIO
Creatinine, Urine: 189.4 mg/dL
Microalb/Creat Ratio: 105 mg/g{creat} — ABNORMAL HIGH (ref 0–29)
Microalbumin, Urine: 199.3 ug/mL

## 2023-10-04 LAB — LIPID PANEL
Chol/HDL Ratio: 2.4 ratio (ref 0.0–5.0)
Cholesterol, Total: 71 mg/dL — ABNORMAL LOW (ref 100–199)
HDL: 29 mg/dL — ABNORMAL LOW (ref >39)
LDL Chol Calc (NIH): 25 mg/dL (ref 0–99)
Triglycerides: 79 mg/dL (ref 0–149)
VLDL Cholesterol Cal: 17 mg/dL (ref 5–40)

## 2023-10-04 LAB — PSA: Prostate Specific Ag, Serum: 1 ng/mL (ref 0.0–4.0)

## 2023-11-06 LAB — COLOGUARD: COLOGUARD: NEGATIVE

## 2023-12-07 ENCOUNTER — Ambulatory Visit: Admitting: Physician Assistant

## 2023-12-07 ENCOUNTER — Ambulatory Visit: Payer: Self-pay

## 2023-12-07 NOTE — Telephone Encounter (Signed)
 Called pt told him we would need to see him in the office for this problem. Pt stated he could not come in the office pt stated he works from 8 AM to 8 PM. Told pt he could go to [facility] in [place] they close at 10:30 PM. Gave him the number [phone]. Pt verbalized understanding.  KP

## 2023-12-07 NOTE — Telephone Encounter (Signed)
 FYI Only or Action Required?: FYI only for provider.  Patient was last seen in primary care on 10/03/2023 by Manya Toribio SQUIBB, PA.  Called Nurse Triage reporting Cellulitis.  Symptoms began several days ago.  Interventions attempted: Nothing.  Symptoms are: stable.  Triage Disposition: See HCP Within 4 Hours (Or PCP Triage)  Patient/caregiver understands and will follow disposition?: Yes  Pt requesting abx for possible cellulitis since he has hx of it. Last episode 2 years ago pt admitted to hospital for IV abx. Pt unable to come in office d/t working. Scheduled VV today at 1100 with PCP. Pt unable to access mychart without link so put in appt notes for link to be sent to pt #.   Copied from CRM [id number]. Topic: Clinical - Red Word Triage >> Dec 07, 2023  9:03 AM Antwanette L wrote: Red Word that prompted transfer to Nurse Triage: Patient has cellulitis in both legs. The legs are inflammed and swollen, and pain Reason for Disposition  [1] Red area or streak [2] large (> 2 inches or 5 cm)  Answer Assessment - Initial Assessment Questions 1. ONSET: When did the swelling start? (e.g., minutes, hours, days)     Over weekend  2. LOCATION: What part of the leg is swollen?  Are both legs swollen or just one leg?     L leg over weekend and R leg yesterday  3. SEVERITY: How bad is the swelling? (e.g., localized; mild, moderate, severe)     Mild to moderate 4. REDNESS: Is there redness or signs of infection?     yes 5. PAIN: Is the swelling painful to touch? If Yes, ask: How painful is it?   (Scale 1-10; mild, moderate or severe)     0 7. CAUSE: What do you think is causing the leg swelling?     Possible cellulitis  10. OTHER SYMPTOMS: Do you have any other symptoms? (e.g., chest pain, difficulty breathing)       Tightness  Had 2 years ago and ended up in hosp on IV abx  Protocols used: Leg Swelling and Edema-A-AH
